# Patient Record
Sex: Male | Born: 1950 | Race: White | Hispanic: No | Marital: Married | Smoking: Never smoker
Health system: Southern US, Community
[De-identification: ages and names within clinical notes are randomized; demographics above are authoritative.]

## PROBLEM LIST (undated history)

## (undated) DIAGNOSIS — L570 Actinic keratosis: Secondary | ICD-10-CM

## (undated) HISTORY — DX: Actinic keratosis: L57.0

---

## 2005-01-17 ENCOUNTER — Ambulatory Visit: Payer: Self-pay | Admitting: General Practice

## 2010-04-25 ENCOUNTER — Ambulatory Visit: Payer: Self-pay | Admitting: Unknown Physician Specialty

## 2010-05-10 ENCOUNTER — Ambulatory Visit: Payer: Self-pay | Admitting: Unknown Physician Specialty

## 2010-12-11 ENCOUNTER — Ambulatory Visit: Payer: Self-pay

## 2012-05-06 ENCOUNTER — Ambulatory Visit: Payer: Self-pay

## 2012-05-06 LAB — CREATININE, SERUM: EGFR (African American): >60

## 2012-05-13 ENCOUNTER — Ambulatory Visit: Payer: Self-pay | Admitting: Internal Medicine

## 2012-06-01 ENCOUNTER — Ambulatory Visit: Payer: Self-pay | Admitting: Neurology

## 2012-10-26 ENCOUNTER — Ambulatory Visit: Payer: Self-pay | Admitting: Specialist

## 2013-12-14 ENCOUNTER — Ambulatory Visit: Payer: Self-pay | Admitting: Internal Medicine

## 2013-12-14 LAB — RAPID STREP-A WITH REFLX: Micro Text Report: NEGATIVE

## 2013-12-17 LAB — BETA STREP CULTURE(ARMC)

## 2014-02-23 DIAGNOSIS — K635 Polyp of colon: Secondary | ICD-10-CM | POA: Insufficient documentation

## 2014-02-23 DIAGNOSIS — K219 Gastro-esophageal reflux disease without esophagitis: Secondary | ICD-10-CM | POA: Insufficient documentation

## 2014-04-13 ENCOUNTER — Emergency Department: Payer: Self-pay | Admitting: Emergency Medicine

## 2014-04-13 DIAGNOSIS — F0781 Postconcussional syndrome: Secondary | ICD-10-CM | POA: Diagnosis not present

## 2014-04-13 DIAGNOSIS — G44309 Post-traumatic headache, unspecified, not intractable: Secondary | ICD-10-CM | POA: Diagnosis not present

## 2014-04-13 DIAGNOSIS — S0990XA Unspecified injury of head, initial encounter: Secondary | ICD-10-CM | POA: Diagnosis not present

## 2014-04-13 DIAGNOSIS — R51 Headache: Secondary | ICD-10-CM | POA: Diagnosis not present

## 2014-04-17 DIAGNOSIS — R519 Headache, unspecified: Secondary | ICD-10-CM | POA: Insufficient documentation

## 2014-04-17 DIAGNOSIS — M545 Low back pain, unspecified: Secondary | ICD-10-CM | POA: Insufficient documentation

## 2014-04-17 DIAGNOSIS — F0781 Postconcussional syndrome: Secondary | ICD-10-CM | POA: Insufficient documentation

## 2014-04-17 DIAGNOSIS — IMO0001 Reserved for inherently not codable concepts without codable children: Secondary | ICD-10-CM | POA: Insufficient documentation

## 2014-04-17 DIAGNOSIS — R6889 Other general symptoms and signs: Secondary | ICD-10-CM | POA: Diagnosis not present

## 2014-04-17 DIAGNOSIS — S060X0A Concussion without loss of consciousness, initial encounter: Secondary | ICD-10-CM | POA: Insufficient documentation

## 2014-04-17 DIAGNOSIS — R51 Headache: Secondary | ICD-10-CM | POA: Diagnosis not present

## 2014-05-10 DIAGNOSIS — Z23 Encounter for immunization: Secondary | ICD-10-CM | POA: Diagnosis not present

## 2014-05-10 DIAGNOSIS — L03113 Cellulitis of right upper limb: Secondary | ICD-10-CM | POA: Diagnosis not present

## 2014-05-10 DIAGNOSIS — L989 Disorder of the skin and subcutaneous tissue, unspecified: Secondary | ICD-10-CM | POA: Diagnosis not present

## 2014-05-22 DIAGNOSIS — L821 Other seborrheic keratosis: Secondary | ICD-10-CM | POA: Diagnosis not present

## 2014-05-22 DIAGNOSIS — L814 Other melanin hyperpigmentation: Secondary | ICD-10-CM | POA: Diagnosis not present

## 2014-05-22 DIAGNOSIS — L820 Inflamed seborrheic keratosis: Secondary | ICD-10-CM | POA: Diagnosis not present

## 2014-06-05 DIAGNOSIS — D485 Neoplasm of uncertain behavior of skin: Secondary | ICD-10-CM | POA: Diagnosis not present

## 2014-06-05 DIAGNOSIS — L080 Pyoderma: Secondary | ICD-10-CM | POA: Diagnosis not present

## 2014-06-05 DIAGNOSIS — T798XXA Other early complications of trauma, initial encounter: Secondary | ICD-10-CM | POA: Diagnosis not present

## 2014-06-14 DIAGNOSIS — E780 Pure hypercholesterolemia: Secondary | ICD-10-CM | POA: Diagnosis not present

## 2014-06-14 DIAGNOSIS — Z0000 Encounter for general adult medical examination without abnormal findings: Secondary | ICD-10-CM | POA: Diagnosis not present

## 2014-06-14 DIAGNOSIS — M79641 Pain in right hand: Secondary | ICD-10-CM | POA: Diagnosis not present

## 2014-06-14 DIAGNOSIS — Z79899 Other long term (current) drug therapy: Secondary | ICD-10-CM | POA: Diagnosis not present

## 2014-06-19 DIAGNOSIS — R51 Headache: Secondary | ICD-10-CM | POA: Diagnosis not present

## 2014-06-19 DIAGNOSIS — Z87820 Personal history of traumatic brain injury: Secondary | ICD-10-CM | POA: Insufficient documentation

## 2014-06-19 DIAGNOSIS — F0781 Postconcussional syndrome: Secondary | ICD-10-CM | POA: Diagnosis not present

## 2014-06-19 DIAGNOSIS — M545 Low back pain: Secondary | ICD-10-CM | POA: Diagnosis not present

## 2014-06-19 DIAGNOSIS — R6889 Other general symptoms and signs: Secondary | ICD-10-CM | POA: Diagnosis not present

## 2014-06-21 DIAGNOSIS — Z79899 Other long term (current) drug therapy: Secondary | ICD-10-CM | POA: Diagnosis not present

## 2014-06-21 DIAGNOSIS — D649 Anemia, unspecified: Secondary | ICD-10-CM | POA: Diagnosis not present

## 2014-06-21 DIAGNOSIS — E780 Pure hypercholesterolemia: Secondary | ICD-10-CM | POA: Diagnosis not present

## 2014-07-04 DIAGNOSIS — N400 Enlarged prostate without lower urinary tract symptoms: Secondary | ICD-10-CM | POA: Diagnosis not present

## 2014-07-04 DIAGNOSIS — N500 Atrophy of testis: Secondary | ICD-10-CM | POA: Diagnosis not present

## 2014-07-04 DIAGNOSIS — E291 Testicular hypofunction: Secondary | ICD-10-CM | POA: Diagnosis not present

## 2014-07-10 DIAGNOSIS — M67441 Ganglion, right hand: Secondary | ICD-10-CM | POA: Diagnosis not present

## 2014-07-10 DIAGNOSIS — M79641 Pain in right hand: Secondary | ICD-10-CM | POA: Diagnosis not present

## 2014-07-10 DIAGNOSIS — M720 Palmar fascial fibromatosis [Dupuytren]: Secondary | ICD-10-CM | POA: Diagnosis not present

## 2014-08-30 DIAGNOSIS — J069 Acute upper respiratory infection, unspecified: Secondary | ICD-10-CM | POA: Diagnosis not present

## 2015-01-01 DIAGNOSIS — N401 Enlarged prostate with lower urinary tract symptoms: Secondary | ICD-10-CM | POA: Diagnosis not present

## 2015-01-02 DIAGNOSIS — N401 Enlarged prostate with lower urinary tract symptoms: Secondary | ICD-10-CM | POA: Diagnosis not present

## 2015-01-02 DIAGNOSIS — R350 Frequency of micturition: Secondary | ICD-10-CM | POA: Diagnosis not present

## 2015-01-02 DIAGNOSIS — E291 Testicular hypofunction: Secondary | ICD-10-CM | POA: Diagnosis not present

## 2015-04-27 DIAGNOSIS — J069 Acute upper respiratory infection, unspecified: Secondary | ICD-10-CM | POA: Diagnosis not present

## 2015-07-17 DIAGNOSIS — E7800 Pure hypercholesterolemia, unspecified: Secondary | ICD-10-CM | POA: Diagnosis not present

## 2015-07-17 DIAGNOSIS — Z125 Encounter for screening for malignant neoplasm of prostate: Secondary | ICD-10-CM | POA: Diagnosis not present

## 2015-07-17 DIAGNOSIS — Z79899 Other long term (current) drug therapy: Secondary | ICD-10-CM | POA: Diagnosis not present

## 2015-07-24 DIAGNOSIS — R7309 Other abnormal glucose: Secondary | ICD-10-CM | POA: Diagnosis not present

## 2015-07-24 DIAGNOSIS — Z125 Encounter for screening for malignant neoplasm of prostate: Secondary | ICD-10-CM | POA: Diagnosis not present

## 2015-07-24 DIAGNOSIS — E7800 Pure hypercholesterolemia, unspecified: Secondary | ICD-10-CM | POA: Diagnosis not present

## 2015-07-24 DIAGNOSIS — Z79899 Other long term (current) drug therapy: Secondary | ICD-10-CM | POA: Diagnosis not present

## 2015-07-24 DIAGNOSIS — Z0000 Encounter for general adult medical examination without abnormal findings: Secondary | ICD-10-CM | POA: Diagnosis not present

## 2015-07-24 DIAGNOSIS — F0781 Postconcussional syndrome: Secondary | ICD-10-CM | POA: Diagnosis not present

## 2015-10-18 ENCOUNTER — Emergency Department
Admission: EM | Admit: 2015-10-18 | Discharge: 2015-10-18 | Disposition: A | Discharge status: Home / Self Care | Payer: Commercial Managed Care - HMO | Attending: Emergency Medicine | Admitting: Emergency Medicine

## 2015-10-18 DIAGNOSIS — L500 Allergic urticaria: Secondary | ICD-10-CM | POA: Insufficient documentation

## 2015-10-18 DIAGNOSIS — L509 Urticaria, unspecified: Secondary | ICD-10-CM | POA: Diagnosis not present

## 2015-10-18 DIAGNOSIS — T7840XA Allergy, unspecified, initial encounter: Secondary | ICD-10-CM

## 2015-10-18 MED ORDER — FAMOTIDINE 20 MG PO TABS: 20.0000 mg | ORAL_TABLET | Freq: Two times a day (BID) | ORAL | 0 refills | Status: DC

## 2015-10-18 MED ORDER — SODIUM CHLORIDE 0.9 % IV SOLN: INTRAVENOUS | Status: DC

## 2015-10-18 MED ORDER — SODIUM CHLORIDE 0.9 % IV BOLUS (SEPSIS)
1000.0000 mL | Freq: Once | INTRAVENOUS | Status: AC
  Administered 2015-10-18: 1000 mL via INTRAVENOUS

## 2015-10-18 MED ORDER — FAMOTIDINE IN NACL 20-0.9 MG/50ML-% IV SOLN
INTRAVENOUS | Status: AC
  Filled 2015-10-18: qty 50

## 2015-10-18 MED ORDER — FAMOTIDINE IN NACL 20-0.9 MG/50ML-% IV SOLN
20.0000 mg | Freq: Once | INTRAVENOUS | Status: AC
  Administered 2015-10-18: 20 mg via INTRAVENOUS

## 2015-10-18 MED ORDER — EPINEPHRINE 0.3 MG/0.3ML IJ SOAJ: 0.3000 mg | Freq: Once | INTRAMUSCULAR | 1 refills | Status: AC

## 2015-10-18 MED ORDER — PREDNISONE 10 MG (21) PO TBPK: ORAL_TABLET | ORAL | 0 refills | Status: DC

## 2015-10-18 MED ORDER — DIPHENHYDRAMINE HCL 50 MG/ML IJ SOLN
12.5000 mg | Freq: Once | INTRAMUSCULAR | Status: AC
  Administered 2015-10-18: 12.5 mg via INTRAVENOUS
  Filled 2015-10-18: qty 1

## 2015-10-18 MED ORDER — METHYLPREDNISOLONE SODIUM SUCC 125 MG IJ SOLR
125.0000 mg | Freq: Once | INTRAMUSCULAR | Status: AC
  Administered 2015-10-18: 125 mg via INTRAVENOUS
  Filled 2015-10-18: qty 2

## 2015-10-18 NOTE — ED Notes (Signed)
Discharge instructions reviewed with patient. Patient verbalized understanding. Patient ambulated to lobby without difficulty.   

## 2015-10-18 NOTE — ED Provider Notes (Signed)
[facility] Emergency Department Provider Note  ____________________________________________  Time seen: Approximately 7:51 PM  I have reviewed the triage vital signs and the nursing notes.   HISTORY  Chief Complaint Allergic Reaction   HPI Franklin Eaton is a 65 y.o. male who presents to the emergency department for evaluation of allergic reaction. Takes that after working in the yard [employer], he came in and took a shower and then noticed that he was itching and noted hives on his buttocks, pelvic region, lower extremities, and hands. He states that he took 50 mg Benadryl because he felt that his throat was swelling and feeling like his tongue was swelling. He states that the Benadryl that that feeling away, but continues to have intense itching in his hands and lower trunk/pelvic region. He has not had this reaction in the past. He is otherwise healthy and doesn't take any medications and has no chronic illness.  History reviewed. No pertinent past medical history.  There are no active problems to display for this patient.   History reviewed. No pertinent surgical history.  Prior to Admission medications   Medication Sig Start Date End Date Taking? Authorizing Provider  EPINEPHrine 0.3 mg/0.3 mL IJ SOAJ injection Inject 0.3 mLs (0.3 mg total) into the muscle once. 10/18/15 10/18/15  Victorino Dike, FNP  famotidine (PEPCID) 20 MG tablet Take 1 tablet (20 mg total) by mouth 2 (two) times daily. 10/18/15 10/25/15  Victorino Dike, FNP  predniSONE (STERAPRED UNI-PAK 21 TAB) 10 MG (21) TBPK tablet Take 6 tablets on day 1 Take 5 tablets on day 2 Take 4 tablets on day 3 Take 3 tablets on day 4 Take 2 tablets on day 5 Take 1 tablet on day 6 10/18/15   Victorino Dike, FNP    Allergies Review of patient's allergies indicates no known allergies.  Family History  Problem Relation Age of Onset  . Hyperlipidemia Brother     Social History Social History   Substance Use Topics  . Smoking status: Never Smoker  . Smokeless tobacco: Never Used  . Alcohol use No    Review of Systems  Constitutional: Negative for fever/chills Respiratory: Negative for shortness of breath. Musculoskeletal: Negative for pain. Skin: Positive for hives and itching Neurological: Negative for headaches, focal weakness or numbness. ____________________________________________   PHYSICAL EXAM:  VITAL SIGNS: ED Triage Vitals [10/18/15 1943]  Enc Vitals Group     BP 119/80     Pulse Rate 61     Resp 18     Temp 97.9 F (36.6 C)     Temp Source Oral     SpO2 97 %     Weight 130 lb (59 kg)     Height 5\' 7"  (1.702 m)     Head Circumference      Peak Flow      Pain Score      Pain Loc      Pain Edu?      Excl. in [place]?      Constitutional: Alert and oriented. Well appearing and in no acute distress. Eyes: Conjunctivae are normal. EOMI. Nose: No congestion/rhinnorhea. Mouth/Throat: Mucous membranes are moist. Airway is patent without edema. Neck: No stridor. Cardiovascular: Good peripheral circulation. Respiratory: Normal respiratory effort.  No retractions. Lungs to auscultation throughout. Musculoskeletal: FROM throughout. Neurologic:  Normal speech and language. No gross focal neurologic deficits are appreciated. Skin:  Hives present on his lower back, buttocks, pelvis, bilateral lower extremities, and erythema  to bilateral hands.  ____________________________________________   LABS (all labs ordered are listed, but only abnormal results are displayed)  Labs Reviewed - No data to display ____________________________________________  EKG   ____________________________________________  RADIOLOGY  Not indicated ____________________________________________   PROCEDURES  Procedure(s) performed: None ____________________________________________   INITIAL IMPRESSION / ASSESSMENT AND PLAN / ED COURSE  Pertinent labs & imaging results  that were available during my care of the patient were reviewed by me and considered in my medical decision making (see chart for details).  IV Solu-Medrol, fluids, and Pepcid ordered. Patient has already had 50 mg Benadryl prior to arrival so we will not give him any additional this time.  See notes from the ED Course  He will be advised to take the medications as prescribed including benadryl every 6 hours. He was given a prescription for an Epi-Pen and advised on indications for use.  He was advised to follow up with Dr. Doy Hutching as needed.  He was also given strict return precautions and verbalized understanding. ____________________________________________   FINAL CLINICAL IMPRESSION(S) / ED DIAGNOSES  Final diagnoses:  Hives  Allergic reaction, initial encounter    New Prescriptions   EPINEPHRINE 0.3 MG/0.3 ML IJ SOAJ INJECTION    Inject 0.3 mLs (0.3 mg total) into the muscle once.   FAMOTIDINE (PEPCID) 20 MG TABLET    Take 1 tablet (20 mg total) by mouth 2 (two) times daily.   PREDNISONE (STERAPRED UNI-PAK 21 TAB) 10 MG (21) TBPK TABLET    Take 6 tablets on day 1 Take 5 tablets on day 2 Take 4 tablets on day 3 Take 3 tablets on day 4 Take 2 tablets on day 5 Take 1 tablet on day 6    Note:  This document was prepared using Dragon voice recognition software and may include unintentional dictation errors.    Victorino Dike, FNP 10/18/15 [id number]    Hinda Kehr, MD 10/18/15 [id number]

## 2015-10-18 NOTE — ED Notes (Signed)
PA at bedside.

## 2015-10-18 NOTE — Discharge Instructions (Signed)
Continue taking the Benadryl in addition to the prescribed medications.  Return to the ER immediately for return of tongue swelling, throat swelling, or other symptoms of concern.

## 2015-10-18 NOTE — ED Triage Notes (Signed)
Pt reports he was working in yard today. Pt showered and then started to notice his hands itching. Itching progressed to feet.   Pt c/o hand/feet itching, rash on pelvic area, buttocks, and back. Pt reports he felt like his tongue was swelling/problems swallowing.   Pt took 2 benadryl 30 minutes ago, and has reports he is able to swallow, however still feels like his tongue is swollen

## 2015-12-31 DIAGNOSIS — R351 Nocturia: Secondary | ICD-10-CM | POA: Diagnosis not present

## 2015-12-31 DIAGNOSIS — R3914 Feeling of incomplete bladder emptying: Secondary | ICD-10-CM | POA: Diagnosis not present

## 2015-12-31 DIAGNOSIS — N401 Enlarged prostate with lower urinary tract symptoms: Secondary | ICD-10-CM | POA: Diagnosis not present

## 2015-12-31 DIAGNOSIS — R350 Frequency of micturition: Secondary | ICD-10-CM | POA: Diagnosis not present

## 2016-02-05 DIAGNOSIS — D1800 Hemangioma unspecified site: Secondary | ICD-10-CM | POA: Diagnosis not present

## 2016-02-05 DIAGNOSIS — D485 Neoplasm of uncertain behavior of skin: Secondary | ICD-10-CM | POA: Diagnosis not present

## 2016-02-05 DIAGNOSIS — L821 Other seborrheic keratosis: Secondary | ICD-10-CM | POA: Diagnosis not present

## 2016-02-05 DIAGNOSIS — C44629 Squamous cell carcinoma of skin of left upper limb, including shoulder: Secondary | ICD-10-CM | POA: Diagnosis not present

## 2016-02-05 DIAGNOSIS — L814 Other melanin hyperpigmentation: Secondary | ICD-10-CM | POA: Diagnosis not present

## 2016-03-03 DIAGNOSIS — C44629 Squamous cell carcinoma of skin of left upper limb, including shoulder: Secondary | ICD-10-CM | POA: Diagnosis not present

## 2016-03-03 DIAGNOSIS — C4492 Squamous cell carcinoma of skin, unspecified: Secondary | ICD-10-CM

## 2016-03-03 HISTORY — DX: Squamous cell carcinoma of skin, unspecified: C44.92

## 2016-04-08 DIAGNOSIS — J069 Acute upper respiratory infection, unspecified: Secondary | ICD-10-CM | POA: Diagnosis not present

## 2016-07-17 DIAGNOSIS — E7800 Pure hypercholesterolemia, unspecified: Secondary | ICD-10-CM | POA: Diagnosis not present

## 2016-07-17 DIAGNOSIS — Z79899 Other long term (current) drug therapy: Secondary | ICD-10-CM | POA: Diagnosis not present

## 2016-07-17 DIAGNOSIS — Z125 Encounter for screening for malignant neoplasm of prostate: Secondary | ICD-10-CM | POA: Diagnosis not present

## 2016-07-17 DIAGNOSIS — R7309 Other abnormal glucose: Secondary | ICD-10-CM | POA: Diagnosis not present

## 2016-07-24 DIAGNOSIS — R7309 Other abnormal glucose: Secondary | ICD-10-CM | POA: Diagnosis not present

## 2016-07-24 DIAGNOSIS — Z79899 Other long term (current) drug therapy: Secondary | ICD-10-CM | POA: Diagnosis not present

## 2016-07-24 DIAGNOSIS — Z125 Encounter for screening for malignant neoplasm of prostate: Secondary | ICD-10-CM | POA: Diagnosis not present

## 2016-07-24 DIAGNOSIS — E7800 Pure hypercholesterolemia, unspecified: Secondary | ICD-10-CM | POA: Diagnosis not present

## 2016-07-24 DIAGNOSIS — Z0000 Encounter for general adult medical examination without abnormal findings: Secondary | ICD-10-CM | POA: Diagnosis not present

## 2016-09-08 DIAGNOSIS — D229 Melanocytic nevi, unspecified: Secondary | ICD-10-CM | POA: Diagnosis not present

## 2016-09-08 DIAGNOSIS — Z85828 Personal history of other malignant neoplasm of skin: Secondary | ICD-10-CM | POA: Diagnosis not present

## 2016-09-08 DIAGNOSIS — L821 Other seborrheic keratosis: Secondary | ICD-10-CM | POA: Diagnosis not present

## 2016-09-08 DIAGNOSIS — L814 Other melanin hyperpigmentation: Secondary | ICD-10-CM | POA: Diagnosis not present

## 2016-09-08 DIAGNOSIS — D1800 Hemangioma unspecified site: Secondary | ICD-10-CM | POA: Diagnosis not present

## 2016-09-08 DIAGNOSIS — L820 Inflamed seborrheic keratosis: Secondary | ICD-10-CM | POA: Diagnosis not present

## 2016-09-08 DIAGNOSIS — L859 Epidermal thickening, unspecified: Secondary | ICD-10-CM | POA: Diagnosis not present

## 2016-11-06 DIAGNOSIS — M79641 Pain in right hand: Secondary | ICD-10-CM | POA: Diagnosis not present

## 2017-02-05 DIAGNOSIS — H2513 Age-related nuclear cataract, bilateral: Secondary | ICD-10-CM | POA: Diagnosis not present

## 2017-04-21 DIAGNOSIS — L820 Inflamed seborrheic keratosis: Secondary | ICD-10-CM | POA: Diagnosis not present

## 2017-04-21 DIAGNOSIS — L821 Other seborrheic keratosis: Secondary | ICD-10-CM | POA: Diagnosis not present

## 2017-04-21 DIAGNOSIS — R21 Rash and other nonspecific skin eruption: Secondary | ICD-10-CM | POA: Diagnosis not present

## 2017-04-21 DIAGNOSIS — Z85828 Personal history of other malignant neoplasm of skin: Secondary | ICD-10-CM | POA: Diagnosis not present

## 2017-04-21 DIAGNOSIS — Z1283 Encounter for screening for malignant neoplasm of skin: Secondary | ICD-10-CM | POA: Diagnosis not present

## 2017-04-21 DIAGNOSIS — L309 Dermatitis, unspecified: Secondary | ICD-10-CM | POA: Diagnosis not present

## 2017-07-20 DIAGNOSIS — Z125 Encounter for screening for malignant neoplasm of prostate: Secondary | ICD-10-CM | POA: Diagnosis not present

## 2017-07-20 DIAGNOSIS — E7800 Pure hypercholesterolemia, unspecified: Secondary | ICD-10-CM | POA: Diagnosis not present

## 2017-07-20 DIAGNOSIS — R7309 Other abnormal glucose: Secondary | ICD-10-CM | POA: Diagnosis not present

## 2017-07-20 DIAGNOSIS — Z79899 Other long term (current) drug therapy: Secondary | ICD-10-CM | POA: Diagnosis not present

## 2017-07-27 DIAGNOSIS — Z0000 Encounter for general adult medical examination without abnormal findings: Secondary | ICD-10-CM | POA: Diagnosis not present

## 2017-07-27 DIAGNOSIS — D649 Anemia, unspecified: Secondary | ICD-10-CM | POA: Diagnosis not present

## 2017-07-27 DIAGNOSIS — E7800 Pure hypercholesterolemia, unspecified: Secondary | ICD-10-CM | POA: Diagnosis not present

## 2017-07-27 DIAGNOSIS — R7309 Other abnormal glucose: Secondary | ICD-10-CM | POA: Diagnosis not present

## 2017-07-27 DIAGNOSIS — Z79899 Other long term (current) drug therapy: Secondary | ICD-10-CM | POA: Diagnosis not present

## 2017-07-27 DIAGNOSIS — M545 Low back pain: Secondary | ICD-10-CM | POA: Diagnosis not present

## 2017-07-27 DIAGNOSIS — G8929 Other chronic pain: Secondary | ICD-10-CM | POA: Diagnosis not present

## 2017-08-13 DIAGNOSIS — J40 Bronchitis, not specified as acute or chronic: Secondary | ICD-10-CM | POA: Diagnosis not present

## 2018-02-09 DIAGNOSIS — Z9889 Other specified postprocedural states: Secondary | ICD-10-CM | POA: Diagnosis not present

## 2018-02-09 DIAGNOSIS — M7551 Bursitis of right shoulder: Secondary | ICD-10-CM | POA: Diagnosis not present

## 2018-02-09 DIAGNOSIS — M19111 Post-traumatic osteoarthritis, right shoulder: Secondary | ICD-10-CM | POA: Diagnosis not present

## 2018-02-09 DIAGNOSIS — G8929 Other chronic pain: Secondary | ICD-10-CM | POA: Diagnosis not present

## 2018-02-09 DIAGNOSIS — M25511 Pain in right shoulder: Secondary | ICD-10-CM | POA: Diagnosis not present

## 2018-02-09 DIAGNOSIS — M7581 Other shoulder lesions, right shoulder: Secondary | ICD-10-CM | POA: Diagnosis not present

## 2018-02-09 DIAGNOSIS — M7541 Impingement syndrome of right shoulder: Secondary | ICD-10-CM | POA: Diagnosis not present

## 2018-12-06 ENCOUNTER — Other Ambulatory Visit (HOSPITAL_COMMUNITY): Payer: Self-pay | Admitting: Sports Medicine

## 2018-12-06 ENCOUNTER — Other Ambulatory Visit: Payer: Self-pay | Admitting: Sports Medicine

## 2018-12-06 DIAGNOSIS — M7541 Impingement syndrome of right shoulder: Secondary | ICD-10-CM | POA: Diagnosis not present

## 2018-12-06 DIAGNOSIS — M25511 Pain in right shoulder: Secondary | ICD-10-CM | POA: Diagnosis not present

## 2018-12-06 DIAGNOSIS — M25512 Pain in left shoulder: Secondary | ICD-10-CM | POA: Diagnosis not present

## 2018-12-06 DIAGNOSIS — Z9889 Other specified postprocedural states: Secondary | ICD-10-CM | POA: Diagnosis not present

## 2018-12-06 DIAGNOSIS — M19111 Post-traumatic osteoarthritis, right shoulder: Secondary | ICD-10-CM

## 2018-12-06 DIAGNOSIS — G8929 Other chronic pain: Secondary | ICD-10-CM | POA: Diagnosis not present

## 2018-12-06 DIAGNOSIS — M778 Other enthesopathies, not elsewhere classified: Secondary | ICD-10-CM

## 2018-12-06 DIAGNOSIS — M7581 Other shoulder lesions, right shoulder: Secondary | ICD-10-CM | POA: Diagnosis not present

## 2018-12-13 ENCOUNTER — Other Ambulatory Visit: Payer: Self-pay

## 2018-12-13 ENCOUNTER — Ambulatory Visit
Admission: RE | Admit: 2018-12-13 | Discharge: 2018-12-13 | Disposition: A | Discharge status: Home / Self Care | Payer: Medicare HMO | Source: Ambulatory Visit | Attending: Sports Medicine | Admitting: Sports Medicine

## 2018-12-13 DIAGNOSIS — Z9889 Other specified postprocedural states: Secondary | ICD-10-CM | POA: Diagnosis not present

## 2018-12-13 DIAGNOSIS — G8929 Other chronic pain: Secondary | ICD-10-CM | POA: Insufficient documentation

## 2018-12-13 DIAGNOSIS — M25511 Pain in right shoulder: Secondary | ICD-10-CM | POA: Diagnosis not present

## 2018-12-13 DIAGNOSIS — M7541 Impingement syndrome of right shoulder: Secondary | ICD-10-CM | POA: Diagnosis not present

## 2018-12-13 DIAGNOSIS — M7581 Other shoulder lesions, right shoulder: Secondary | ICD-10-CM | POA: Diagnosis not present

## 2018-12-13 DIAGNOSIS — M778 Other enthesopathies, not elsewhere classified: Secondary | ICD-10-CM

## 2018-12-13 DIAGNOSIS — M19111 Post-traumatic osteoarthritis, right shoulder: Secondary | ICD-10-CM | POA: Insufficient documentation

## 2018-12-15 DIAGNOSIS — M75121 Complete rotator cuff tear or rupture of right shoulder, not specified as traumatic: Secondary | ICD-10-CM | POA: Diagnosis not present

## 2018-12-15 DIAGNOSIS — M67921 Unspecified disorder of synovium and tendon, right upper arm: Secondary | ICD-10-CM | POA: Diagnosis not present

## 2018-12-15 DIAGNOSIS — M7542 Impingement syndrome of left shoulder: Secondary | ICD-10-CM | POA: Diagnosis not present

## 2018-12-15 DIAGNOSIS — M25511 Pain in right shoulder: Secondary | ICD-10-CM | POA: Diagnosis not present

## 2018-12-15 DIAGNOSIS — M19111 Post-traumatic osteoarthritis, right shoulder: Secondary | ICD-10-CM | POA: Diagnosis not present

## 2018-12-15 DIAGNOSIS — M65811 Other synovitis and tenosynovitis, right shoulder: Secondary | ICD-10-CM | POA: Diagnosis not present

## 2018-12-15 DIAGNOSIS — G8929 Other chronic pain: Secondary | ICD-10-CM | POA: Diagnosis not present

## 2018-12-15 DIAGNOSIS — M25512 Pain in left shoulder: Secondary | ICD-10-CM | POA: Diagnosis not present

## 2018-12-15 DIAGNOSIS — M7522 Bicipital tendinitis, left shoulder: Secondary | ICD-10-CM | POA: Diagnosis not present

## 2018-12-20 DIAGNOSIS — M75121 Complete rotator cuff tear or rupture of right shoulder, not specified as traumatic: Secondary | ICD-10-CM | POA: Diagnosis not present

## 2018-12-20 DIAGNOSIS — M19011 Primary osteoarthritis, right shoulder: Secondary | ICD-10-CM | POA: Diagnosis not present

## 2018-12-22 DIAGNOSIS — M75121 Complete rotator cuff tear or rupture of right shoulder, not specified as traumatic: Secondary | ICD-10-CM | POA: Insufficient documentation

## 2019-01-31 DIAGNOSIS — H0013 Chalazion right eye, unspecified eyelid: Secondary | ICD-10-CM | POA: Diagnosis not present

## 2019-03-24 DIAGNOSIS — M659 Synovitis and tenosynovitis, unspecified: Secondary | ICD-10-CM | POA: Diagnosis not present

## 2019-03-24 DIAGNOSIS — M2011 Hallux valgus (acquired), right foot: Secondary | ICD-10-CM | POA: Diagnosis not present

## 2019-03-24 DIAGNOSIS — M79671 Pain in right foot: Secondary | ICD-10-CM | POA: Diagnosis not present

## 2019-04-14 DIAGNOSIS — M2011 Hallux valgus (acquired), right foot: Secondary | ICD-10-CM | POA: Diagnosis not present

## 2019-04-14 DIAGNOSIS — M659 Synovitis and tenosynovitis, unspecified: Secondary | ICD-10-CM | POA: Diagnosis not present

## 2019-04-14 DIAGNOSIS — M2012 Hallux valgus (acquired), left foot: Secondary | ICD-10-CM | POA: Diagnosis not present

## 2019-04-28 DIAGNOSIS — R310 Gross hematuria: Secondary | ICD-10-CM | POA: Diagnosis not present

## 2019-04-28 DIAGNOSIS — R300 Dysuria: Secondary | ICD-10-CM | POA: Diagnosis not present

## 2019-05-02 ENCOUNTER — Ambulatory Visit: Payer: Medicare HMO

## 2019-05-23 ENCOUNTER — Other Ambulatory Visit: Payer: Self-pay | Admitting: Podiatry

## 2019-05-25 ENCOUNTER — Other Ambulatory Visit: Payer: Self-pay | Admitting: Podiatry

## 2019-05-25 DIAGNOSIS — G5761 Lesion of plantar nerve, right lower limb: Secondary | ICD-10-CM

## 2019-06-06 ENCOUNTER — Other Ambulatory Visit: Payer: Self-pay

## 2019-06-06 ENCOUNTER — Ambulatory Visit
Admission: RE | Admit: 2019-06-06 | Discharge: 2019-06-06 | Disposition: A | Discharge status: Home / Self Care | Payer: Medicare HMO | Source: Ambulatory Visit | Attending: Podiatry | Admitting: Podiatry

## 2019-06-06 DIAGNOSIS — M19071 Primary osteoarthritis, right ankle and foot: Secondary | ICD-10-CM | POA: Diagnosis not present

## 2019-06-06 DIAGNOSIS — M25474 Effusion, right foot: Secondary | ICD-10-CM | POA: Diagnosis not present

## 2019-06-06 DIAGNOSIS — G5761 Lesion of plantar nerve, right lower limb: Secondary | ICD-10-CM | POA: Diagnosis not present

## 2019-06-06 MED ORDER — GADOBUTROL 1 MMOL/ML IV SOLN
5.0000 mL | Freq: Once | INTRAVENOUS | Status: AC | PRN
Start: 2019-06-06 — End: 2019-06-06
  Administered 2019-06-06: 10:00:00 5 mL via INTRAVENOUS

## 2019-06-16 DIAGNOSIS — G5761 Lesion of plantar nerve, right lower limb: Secondary | ICD-10-CM | POA: Diagnosis not present

## 2019-06-16 DIAGNOSIS — M19071 Primary osteoarthritis, right ankle and foot: Secondary | ICD-10-CM | POA: Diagnosis not present

## 2019-06-21 ENCOUNTER — Other Ambulatory Visit: Payer: Self-pay | Admitting: Internal Medicine

## 2019-06-21 DIAGNOSIS — R140 Abdominal distension (gaseous): Secondary | ICD-10-CM

## 2019-06-27 ENCOUNTER — Other Ambulatory Visit: Payer: Self-pay

## 2019-06-27 ENCOUNTER — Ambulatory Visit
Admission: RE | Admit: 2019-06-27 | Discharge: 2019-06-27 | Disposition: A | Discharge status: Home / Self Care | Payer: Medicare HMO | Source: Ambulatory Visit | Attending: Internal Medicine | Admitting: Internal Medicine

## 2019-06-27 DIAGNOSIS — R140 Abdominal distension (gaseous): Secondary | ICD-10-CM | POA: Diagnosis not present

## 2019-11-11 DIAGNOSIS — R05 Cough: Secondary | ICD-10-CM | POA: Diagnosis not present

## 2019-11-11 DIAGNOSIS — G8929 Other chronic pain: Secondary | ICD-10-CM | POA: Diagnosis not present

## 2019-11-11 DIAGNOSIS — R918 Other nonspecific abnormal finding of lung field: Secondary | ICD-10-CM | POA: Diagnosis not present

## 2019-11-11 DIAGNOSIS — Z125 Encounter for screening for malignant neoplasm of prostate: Secondary | ICD-10-CM | POA: Diagnosis not present

## 2019-11-11 DIAGNOSIS — Z79899 Other long term (current) drug therapy: Secondary | ICD-10-CM | POA: Diagnosis not present

## 2019-11-11 DIAGNOSIS — E7800 Pure hypercholesterolemia, unspecified: Secondary | ICD-10-CM | POA: Diagnosis not present

## 2019-11-11 DIAGNOSIS — M79671 Pain in right foot: Secondary | ICD-10-CM | POA: Diagnosis not present

## 2019-11-11 DIAGNOSIS — M47814 Spondylosis without myelopathy or radiculopathy, thoracic region: Secondary | ICD-10-CM | POA: Diagnosis not present

## 2019-11-11 DIAGNOSIS — Z0000 Encounter for general adult medical examination without abnormal findings: Secondary | ICD-10-CM | POA: Diagnosis not present

## 2019-11-11 DIAGNOSIS — J9811 Atelectasis: Secondary | ICD-10-CM | POA: Diagnosis not present

## 2019-11-11 DIAGNOSIS — R7309 Other abnormal glucose: Secondary | ICD-10-CM | POA: Diagnosis not present

## 2019-11-14 ENCOUNTER — Ambulatory Visit: Payer: Medicare HMO | Admitting: Dermatology

## 2019-11-18 DIAGNOSIS — Z79899 Other long term (current) drug therapy: Secondary | ICD-10-CM | POA: Diagnosis not present

## 2019-11-18 DIAGNOSIS — Z125 Encounter for screening for malignant neoplasm of prostate: Secondary | ICD-10-CM | POA: Diagnosis not present

## 2019-11-18 DIAGNOSIS — M79671 Pain in right foot: Secondary | ICD-10-CM | POA: Diagnosis not present

## 2019-11-18 DIAGNOSIS — R7309 Other abnormal glucose: Secondary | ICD-10-CM | POA: Diagnosis not present

## 2019-11-18 DIAGNOSIS — Z0000 Encounter for general adult medical examination without abnormal findings: Secondary | ICD-10-CM | POA: Diagnosis not present

## 2019-11-18 DIAGNOSIS — E7800 Pure hypercholesterolemia, unspecified: Secondary | ICD-10-CM | POA: Diagnosis not present

## 2019-11-18 DIAGNOSIS — R05 Cough: Secondary | ICD-10-CM | POA: Diagnosis not present

## 2019-11-23 ENCOUNTER — Ambulatory Visit: Payer: Medicare HMO | Admitting: Dermatology

## 2019-11-23 ENCOUNTER — Other Ambulatory Visit: Payer: Self-pay

## 2019-11-23 DIAGNOSIS — D1800 Hemangioma unspecified site: Secondary | ICD-10-CM | POA: Diagnosis not present

## 2019-11-23 DIAGNOSIS — W57XXXA Bitten or stung by nonvenomous insect and other nonvenomous arthropods, initial encounter: Secondary | ICD-10-CM

## 2019-11-23 DIAGNOSIS — L814 Other melanin hyperpigmentation: Secondary | ICD-10-CM | POA: Diagnosis not present

## 2019-11-23 DIAGNOSIS — D229 Melanocytic nevi, unspecified: Secondary | ICD-10-CM

## 2019-11-23 DIAGNOSIS — L570 Actinic keratosis: Secondary | ICD-10-CM

## 2019-11-23 DIAGNOSIS — L821 Other seborrheic keratosis: Secondary | ICD-10-CM

## 2019-11-23 DIAGNOSIS — Z85828 Personal history of other malignant neoplasm of skin: Secondary | ICD-10-CM

## 2019-11-23 DIAGNOSIS — S50861A Insect bite (nonvenomous) of right forearm, initial encounter: Secondary | ICD-10-CM

## 2019-11-23 DIAGNOSIS — L820 Inflamed seborrheic keratosis: Secondary | ICD-10-CM

## 2019-11-23 DIAGNOSIS — Z1283 Encounter for screening for malignant neoplasm of skin: Secondary | ICD-10-CM

## 2019-11-23 DIAGNOSIS — L578 Other skin changes due to chronic exposure to nonionizing radiation: Secondary | ICD-10-CM

## 2019-11-23 MED ORDER — TRIAMCINOLONE ACETONIDE 0.1 % EX OINT: 1.0000 "application " | TOPICAL_OINTMENT | Freq: Two times a day (BID) | CUTANEOUS | 1 refills | Status: DC | PRN

## 2019-11-23 NOTE — Patient Instructions (Signed)
Cryotherapy Aftercare  . Wash gently with soap and water everyday.   . Apply Vaseline and Band-Aid daily until healed.  Prior to procedure, discussed risks of blister formation, small wound, skin dyspigmentation, or rare scar following cryotherapy.    Topical steroids (such as triamcinolone, fluocinolone, fluocinonide, mometasone, clobetasol, halobetasol, betamethasone, hydrocortisone) can cause thinning and lightening of the skin if they are used for too long in the same area. Your physician has selected the right strength medicine for your problem and area affected on the body. Please use your medication only as directed by your physician to prevent side effects.   

## 2019-11-23 NOTE — Progress Notes (Addendum)
Follow-Up Visit   Subjective  Franklin Eaton is a 69 y.o. male who presents for the following: No chief complaint on file.Marland Kitchen  He has some itchy spots on his back and face and scalp.  Red bumps on arms and legs, he thinks it may be poison ivy.  Has been treated with steroid for that in past.  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    The following portions of the chart were reviewed this encounter and updated as appropriate:     Review of Systems: No other skin or systemic complaints except as noted in HPI or Assessment and Plan.  Objective  Well appearing patient in no apparent distress; mood and affect are within normal limits.  A focused examination was performed including face, scalp, torso, arms, hands, left foot. Relevant physical exam findings are noted in the Assessment and Plan.  Objective  Right Forearm - Posterior, left foot, left knee: Pink papules, some with vesiculation  Objective  Left Forearm - Posterior: Well healed scar with no evidence of recurrence  Objective  Left Upper Back spinal x2, left back x1, right posterior shoulder x1, left mandible x1, left temporal scalp x1 (6): Erythematous keratotic or waxy stuck-on papule or plaque.   Objective  Dorsum of Nose x1: Erythematous thin papules/macules with gritty scale.   Assessment & Plan    Lentigines - Scattered tan macules - Discussed due to sun exposure - Benign, observe - Call for any changes  Seborrheic Keratoses - Stuck-on, waxy, tan-brown papules and plaques  - Discussed benign etiology and prognosis. - Observe - Call for any changes  Melanocytic Nevi - Tan-brown and/or pink-flesh-colored symmetric macules and papules - Benign appearing on exam today - Observation - Call clinic for new or changing moles - Recommend daily use of broad spectrum spf 30+ sunscreen to sun-exposed areas.   Hemangiomas - Red papules - Discussed benign nature - Observe - Call for any changes  Actinic Damage -  diffuse scaly erythematous macules with underlying dyspigmentation - Recommend daily broad spectrum sunscreen SPF 30+ to sun-exposed areas, reapply every 2 hours as needed.  - Call for new or changing lesions.  Skin cancer screening performed today.  Bug bite without infection, initial encounter Right Forearm - Posterior, left foot, left knee  Discussed using Deet containing insect repellant when outdoors Also long sleeves, pants, socks when outdoors Start Triamcinolone ointment bid to aas prn itch  triamcinolone ointment (KENALOG) 0.1 % - Right Forearm - Posterior, left foot, left knee  History of SCC (squamous cell carcinoma) of skin Left Forearm - Posterior  Clear. Observe for recurrence. Call clinic for new or changing lesions.  Recommend regular skin exams, daily broad-spectrum spf 30+ sunscreen use, and photoprotection.     Inflamed seborrheic keratosis (6) Left Upper Back spinal x2, left back x1, right posterior shoulder x1, left mandible x1, left temporal scalp x1  Destruction of lesion - Left Upper Back spinal x2, left back x1, right posterior shoulder x1, left mandible x1, left temporal scalp x1  Destruction method: cryotherapy   Informed consent: discussed and consent obtained   Lesion destroyed using liquid nitrogen: Yes   Region frozen until ice ball extended beyond lesion: Yes   Outcome: patient tolerated procedure well with no complications   Post-procedure details: wound care instructions given    AK (actinic keratosis) Dorsum of Nose x1  RTC 4-6 weeks if not resolved.   Destruction of lesion - Dorsum of Nose x1  Destruction method: cryotherapy   Informed consent: discussed and consent obtained   Lesion destroyed using liquid nitrogen: Yes   Region frozen until ice ball extended beyond lesion: Yes   Outcome: patient tolerated procedure well with no complications   Post-procedure details: wound care instructions given    Return in about 1 year (around  11/22/2020) for annual skin exam.   I, Emelia Salisbury, CMA, am acting as scribe for Brendolyn Patty, MD.  Documentation: I have reviewed the above documentation for accuracy and completeness, and I agree with the above.  Brendolyn Patty MD

## 2019-11-29 DIAGNOSIS — R05 Cough: Secondary | ICD-10-CM | POA: Diagnosis not present

## 2019-11-29 DIAGNOSIS — R9389 Abnormal findings on diagnostic imaging of other specified body structures: Secondary | ICD-10-CM | POA: Diagnosis not present

## 2019-11-30 ENCOUNTER — Encounter: Payer: Self-pay | Admitting: Podiatry

## 2019-11-30 ENCOUNTER — Other Ambulatory Visit: Payer: Self-pay

## 2019-11-30 ENCOUNTER — Ambulatory Visit: Payer: Medicare HMO | Admitting: Podiatry

## 2019-11-30 DIAGNOSIS — M19079 Primary osteoarthritis, unspecified ankle and foot: Secondary | ICD-10-CM

## 2019-11-30 NOTE — Progress Notes (Signed)
  Subjective:  Patient ID: Franklin Eaton, male    DOB: Jan 09, 1951,  MRN: [id number] HPI Chief Complaint  Patient presents with  . Foot Pain    Patient presents today for neuroma/hammertoe right foot.  "it feels like a marshmellow between my foot"  he states it hurts all day, every day and has been seeing Dr/ Vickki Muff for treatment    69 y.o. male presents with the above complaint.   ROS: Denies fever chills nausea vomiting muscle aches pains calf pain back pain chest pain shortness of breath.  Past Medical History:  Diagnosis Date  . Squamous cell carcinoma of skin 03/03/2016   left distal forearm/excision   No past surgical history on file.  Current Outpatient Medications:  .  gabapentin (NEURONTIN) 300 MG capsule, Take by mouth., Disp: , Rfl:  .  omeprazole (PRILOSEC) 40 MG capsule, Take by mouth., Disp: , Rfl:  .  triamcinolone ointment (KENALOG) 0.1 %, Apply 1 application topically 2 (two) times daily as needed (Rash)., Disp: 60 g, Rfl: 1  No Known Allergies Review of Systems Objective:  There were no vitals filed for this visit.  General: Well developed, nourished, in no acute distress, alert and oriented x3   Dermatological: Skin is warm, dry and supple bilateral. Nails x 10 are well maintained; remaining integument appears unremarkable at this time. There are no open sores, no preulcerative lesions, no rash or signs of infection present.  Vascular: Dorsalis Pedis artery and Posterior Tibial artery pedal pulses are 2/4 bilateral with immedate capillary fill time. Pedal hair growth present. No varicosities and no lower extremity edema present bilateral.   Neruologic: Grossly intact via light touch bilateral. Vibratory intact via tuning fork bilateral. Protective threshold with Semmes Wienstein monofilament intact to all pedal sites bilateral. Patellar and Achilles deep tendon reflexes 2+ bilateral. No Babinski or clonus noted bilateral.   Musculoskeletal: No gross boney  pedal deformities bilateral. No pain, crepitus, or limitation noted with foot and ankle range of motion bilateral. Muscular strength 5/5 in all groups tested bilateral.  Pain on palpation of the third metatarsophalangeal joint right foot.  There is obvious swelling and thickening of the bone in this area.   Gait: Unassisted, Nonantalgic.    Radiographs:  Radiographs were not taken here.  He is going to obtain radiographs from [place] [facility].  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  Plan: At this point I would not see if he can come back with Lil[facility] Channel and see about getting a set of orthotics made that we will place a small metatarsal pad in the arch area along the metatarsal length with a cut out beneath the second and third metatarsals of the right foot.  Otherwise the orthotic needs to be a neutral position.  We also discussed contrast baths and topical anti-inflammatories.     Sarahbeth Cashin T. [place], [place]

## 2019-12-21 ENCOUNTER — Ambulatory Visit (INDEPENDENT_AMBULATORY_CARE_PROVIDER_SITE_OTHER): Payer: Medicare HMO | Admitting: Orthotics

## 2019-12-21 ENCOUNTER — Other Ambulatory Visit: Payer: Self-pay

## 2019-12-21 DIAGNOSIS — M19079 Primary osteoarthritis, unspecified ankle and foot: Secondary | ICD-10-CM

## 2019-12-21 DIAGNOSIS — M79673 Pain in unspecified foot: Secondary | ICD-10-CM | POA: Diagnosis not present

## 2019-12-21 NOTE — Progress Notes (Signed)
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  Plan on R to have 2/3 offload and proximal dispersion pad.   Hug arch and 33mm deep heel cup neutral.

## 2020-01-04 DIAGNOSIS — Z23 Encounter for immunization: Secondary | ICD-10-CM | POA: Diagnosis not present

## 2020-01-25 ENCOUNTER — Ambulatory Visit (INDEPENDENT_AMBULATORY_CARE_PROVIDER_SITE_OTHER): Payer: Medicare HMO | Admitting: Orthotics

## 2020-01-25 ENCOUNTER — Other Ambulatory Visit: Payer: Self-pay

## 2020-01-25 DIAGNOSIS — M19079 Primary osteoarthritis, unspecified ankle and foot: Secondary | ICD-10-CM

## 2020-01-25 NOTE — Progress Notes (Signed)
Patient came in today to pick up custom made foot orthotics.  The goals were accomplished and the patient reported no dissatisfaction with said orthotics.  Patient was advised of breakin period and how to report any issues. 

## 2020-01-30 DIAGNOSIS — M75121 Complete rotator cuff tear or rupture of right shoulder, not specified as traumatic: Secondary | ICD-10-CM | POA: Diagnosis not present

## 2020-02-07 ENCOUNTER — Other Ambulatory Visit: Payer: Self-pay

## 2020-02-07 ENCOUNTER — Ambulatory Visit: Payer: Medicare HMO | Admitting: Orthotics

## 2020-02-07 DIAGNOSIS — M19079 Primary osteoarthritis, unspecified ankle and foot: Secondary | ICD-10-CM

## 2020-02-07 DIAGNOSIS — Z87820 Personal history of traumatic brain injury: Secondary | ICD-10-CM

## 2020-02-07 NOTE — Progress Notes (Signed)
Adding morton' neuroma offload and pad to left only.

## 2020-02-14 DIAGNOSIS — E7800 Pure hypercholesterolemia, unspecified: Secondary | ICD-10-CM | POA: Diagnosis not present

## 2020-02-14 DIAGNOSIS — M19111 Post-traumatic osteoarthritis, right shoulder: Secondary | ICD-10-CM | POA: Diagnosis not present

## 2020-02-14 DIAGNOSIS — Z01818 Encounter for other preprocedural examination: Secondary | ICD-10-CM | POA: Diagnosis not present

## 2020-02-14 DIAGNOSIS — M75121 Complete rotator cuff tear or rupture of right shoulder, not specified as traumatic: Secondary | ICD-10-CM | POA: Diagnosis not present

## 2020-03-02 DIAGNOSIS — G8929 Other chronic pain: Secondary | ICD-10-CM | POA: Diagnosis not present

## 2020-03-02 DIAGNOSIS — M25711 Osteophyte, right shoulder: Secondary | ICD-10-CM | POA: Diagnosis not present

## 2020-03-02 DIAGNOSIS — E7800 Pure hypercholesterolemia, unspecified: Secondary | ICD-10-CM | POA: Diagnosis not present

## 2020-03-02 DIAGNOSIS — M75121 Complete rotator cuff tear or rupture of right shoulder, not specified as traumatic: Secondary | ICD-10-CM | POA: Diagnosis not present

## 2020-03-02 DIAGNOSIS — M7521 Bicipital tendinitis, right shoulder: Secondary | ICD-10-CM | POA: Diagnosis not present

## 2020-03-02 DIAGNOSIS — K219 Gastro-esophageal reflux disease without esophagitis: Secondary | ICD-10-CM | POA: Diagnosis not present

## 2020-03-02 DIAGNOSIS — M19111 Post-traumatic osteoarthritis, right shoulder: Secondary | ICD-10-CM | POA: Diagnosis not present

## 2020-03-02 DIAGNOSIS — G8918 Other acute postprocedural pain: Secondary | ICD-10-CM | POA: Diagnosis not present

## 2020-03-02 DIAGNOSIS — Z8711 Personal history of peptic ulcer disease: Secondary | ICD-10-CM | POA: Diagnosis not present

## 2020-03-02 DIAGNOSIS — Z85828 Personal history of other malignant neoplasm of skin: Secondary | ICD-10-CM | POA: Diagnosis not present

## 2020-03-02 DIAGNOSIS — D649 Anemia, unspecified: Secondary | ICD-10-CM | POA: Diagnosis not present

## 2020-03-07 ENCOUNTER — Ambulatory Visit: Payer: Medicare HMO | Admitting: Orthotics

## 2020-03-07 ENCOUNTER — Other Ambulatory Visit: Payer: Self-pay

## 2020-03-07 DIAGNOSIS — Z87820 Personal history of traumatic brain injury: Secondary | ICD-10-CM

## 2020-03-07 DIAGNOSIS — M19079 Primary osteoarthritis, unspecified ankle and foot: Secondary | ICD-10-CM

## 2020-03-07 NOTE — Progress Notes (Signed)
Picked up modiffied foot orthotic

## 2020-03-09 DIAGNOSIS — M19111 Post-traumatic osteoarthritis, right shoulder: Secondary | ICD-10-CM | POA: Diagnosis not present

## 2020-03-09 DIAGNOSIS — M25511 Pain in right shoulder: Secondary | ICD-10-CM | POA: Diagnosis not present

## 2020-03-09 DIAGNOSIS — R293 Abnormal posture: Secondary | ICD-10-CM | POA: Diagnosis not present

## 2020-03-12 DIAGNOSIS — M19111 Post-traumatic osteoarthritis, right shoulder: Secondary | ICD-10-CM | POA: Diagnosis not present

## 2020-03-12 DIAGNOSIS — M25511 Pain in right shoulder: Secondary | ICD-10-CM | POA: Diagnosis not present

## 2020-03-12 DIAGNOSIS — R293 Abnormal posture: Secondary | ICD-10-CM | POA: Diagnosis not present

## 2020-03-13 DIAGNOSIS — R293 Abnormal posture: Secondary | ICD-10-CM | POA: Diagnosis not present

## 2020-03-13 DIAGNOSIS — M19111 Post-traumatic osteoarthritis, right shoulder: Secondary | ICD-10-CM | POA: Diagnosis not present

## 2020-03-13 DIAGNOSIS — M25511 Pain in right shoulder: Secondary | ICD-10-CM | POA: Diagnosis not present

## 2020-03-15 DIAGNOSIS — R293 Abnormal posture: Secondary | ICD-10-CM | POA: Diagnosis not present

## 2020-03-15 DIAGNOSIS — Z471 Aftercare following joint replacement surgery: Secondary | ICD-10-CM | POA: Diagnosis not present

## 2020-03-15 DIAGNOSIS — Z96611 Presence of right artificial shoulder joint: Secondary | ICD-10-CM | POA: Diagnosis not present

## 2020-03-15 DIAGNOSIS — M25511 Pain in right shoulder: Secondary | ICD-10-CM | POA: Diagnosis not present

## 2020-03-15 DIAGNOSIS — M19111 Post-traumatic osteoarthritis, right shoulder: Secondary | ICD-10-CM | POA: Diagnosis not present

## 2020-03-19 DIAGNOSIS — M25511 Pain in right shoulder: Secondary | ICD-10-CM | POA: Diagnosis not present

## 2020-03-19 DIAGNOSIS — R293 Abnormal posture: Secondary | ICD-10-CM | POA: Diagnosis not present

## 2020-03-19 DIAGNOSIS — M19111 Post-traumatic osteoarthritis, right shoulder: Secondary | ICD-10-CM | POA: Diagnosis not present

## 2020-03-20 DIAGNOSIS — R293 Abnormal posture: Secondary | ICD-10-CM | POA: Diagnosis not present

## 2020-03-20 DIAGNOSIS — M25511 Pain in right shoulder: Secondary | ICD-10-CM | POA: Diagnosis not present

## 2020-03-20 DIAGNOSIS — M19111 Post-traumatic osteoarthritis, right shoulder: Secondary | ICD-10-CM | POA: Diagnosis not present

## 2020-03-22 DIAGNOSIS — R293 Abnormal posture: Secondary | ICD-10-CM | POA: Diagnosis not present

## 2020-03-22 DIAGNOSIS — M25511 Pain in right shoulder: Secondary | ICD-10-CM | POA: Diagnosis not present

## 2020-03-22 DIAGNOSIS — M19111 Post-traumatic osteoarthritis, right shoulder: Secondary | ICD-10-CM | POA: Diagnosis not present

## 2020-03-27 DIAGNOSIS — R293 Abnormal posture: Secondary | ICD-10-CM | POA: Diagnosis not present

## 2020-03-27 DIAGNOSIS — M25511 Pain in right shoulder: Secondary | ICD-10-CM | POA: Diagnosis not present

## 2020-03-27 DIAGNOSIS — M19111 Post-traumatic osteoarthritis, right shoulder: Secondary | ICD-10-CM | POA: Diagnosis not present

## 2020-03-29 DIAGNOSIS — M25511 Pain in right shoulder: Secondary | ICD-10-CM | POA: Diagnosis not present

## 2020-03-29 DIAGNOSIS — M19111 Post-traumatic osteoarthritis, right shoulder: Secondary | ICD-10-CM | POA: Diagnosis not present

## 2020-03-29 DIAGNOSIS — R293 Abnormal posture: Secondary | ICD-10-CM | POA: Diagnosis not present

## 2020-04-03 DIAGNOSIS — R293 Abnormal posture: Secondary | ICD-10-CM | POA: Diagnosis not present

## 2020-04-03 DIAGNOSIS — M25511 Pain in right shoulder: Secondary | ICD-10-CM | POA: Diagnosis not present

## 2020-04-03 DIAGNOSIS — M19111 Post-traumatic osteoarthritis, right shoulder: Secondary | ICD-10-CM | POA: Diagnosis not present

## 2020-04-05 DIAGNOSIS — R293 Abnormal posture: Secondary | ICD-10-CM | POA: Diagnosis not present

## 2020-04-05 DIAGNOSIS — M25511 Pain in right shoulder: Secondary | ICD-10-CM | POA: Diagnosis not present

## 2020-04-05 DIAGNOSIS — M19111 Post-traumatic osteoarthritis, right shoulder: Secondary | ICD-10-CM | POA: Diagnosis not present

## 2020-04-06 DIAGNOSIS — Z23 Encounter for immunization: Secondary | ICD-10-CM | POA: Diagnosis not present

## 2020-04-10 DIAGNOSIS — R293 Abnormal posture: Secondary | ICD-10-CM | POA: Diagnosis not present

## 2020-04-10 DIAGNOSIS — M25511 Pain in right shoulder: Secondary | ICD-10-CM | POA: Diagnosis not present

## 2020-04-10 DIAGNOSIS — M19111 Post-traumatic osteoarthritis, right shoulder: Secondary | ICD-10-CM | POA: Diagnosis not present

## 2020-04-12 DIAGNOSIS — M25511 Pain in right shoulder: Secondary | ICD-10-CM | POA: Diagnosis not present

## 2020-04-12 DIAGNOSIS — M19111 Post-traumatic osteoarthritis, right shoulder: Secondary | ICD-10-CM | POA: Diagnosis not present

## 2020-04-12 DIAGNOSIS — R293 Abnormal posture: Secondary | ICD-10-CM | POA: Diagnosis not present

## 2020-04-17 DIAGNOSIS — M19111 Post-traumatic osteoarthritis, right shoulder: Secondary | ICD-10-CM | POA: Diagnosis not present

## 2020-04-17 DIAGNOSIS — Z96611 Presence of right artificial shoulder joint: Secondary | ICD-10-CM | POA: Diagnosis not present

## 2020-04-17 DIAGNOSIS — R293 Abnormal posture: Secondary | ICD-10-CM | POA: Diagnosis not present

## 2020-04-17 DIAGNOSIS — M25511 Pain in right shoulder: Secondary | ICD-10-CM | POA: Diagnosis not present

## 2020-04-19 DIAGNOSIS — R293 Abnormal posture: Secondary | ICD-10-CM | POA: Diagnosis not present

## 2020-04-19 DIAGNOSIS — M25511 Pain in right shoulder: Secondary | ICD-10-CM | POA: Diagnosis not present

## 2020-04-19 DIAGNOSIS — M19111 Post-traumatic osteoarthritis, right shoulder: Secondary | ICD-10-CM | POA: Diagnosis not present

## 2020-04-24 DIAGNOSIS — M25511 Pain in right shoulder: Secondary | ICD-10-CM | POA: Diagnosis not present

## 2020-04-24 DIAGNOSIS — R293 Abnormal posture: Secondary | ICD-10-CM | POA: Diagnosis not present

## 2020-04-24 DIAGNOSIS — M19111 Post-traumatic osteoarthritis, right shoulder: Secondary | ICD-10-CM | POA: Diagnosis not present

## 2020-04-26 DIAGNOSIS — R293 Abnormal posture: Secondary | ICD-10-CM | POA: Diagnosis not present

## 2020-04-26 DIAGNOSIS — M25511 Pain in right shoulder: Secondary | ICD-10-CM | POA: Diagnosis not present

## 2020-04-26 DIAGNOSIS — M19111 Post-traumatic osteoarthritis, right shoulder: Secondary | ICD-10-CM | POA: Diagnosis not present

## 2020-05-01 DIAGNOSIS — M19111 Post-traumatic osteoarthritis, right shoulder: Secondary | ICD-10-CM | POA: Diagnosis not present

## 2020-05-01 DIAGNOSIS — M25511 Pain in right shoulder: Secondary | ICD-10-CM | POA: Diagnosis not present

## 2020-05-01 DIAGNOSIS — R293 Abnormal posture: Secondary | ICD-10-CM | POA: Diagnosis not present

## 2020-05-22 DIAGNOSIS — R293 Abnormal posture: Secondary | ICD-10-CM | POA: Diagnosis not present

## 2020-05-22 DIAGNOSIS — M19111 Post-traumatic osteoarthritis, right shoulder: Secondary | ICD-10-CM | POA: Diagnosis not present

## 2020-05-22 DIAGNOSIS — M25511 Pain in right shoulder: Secondary | ICD-10-CM | POA: Diagnosis not present

## 2020-05-29 DIAGNOSIS — M19111 Post-traumatic osteoarthritis, right shoulder: Secondary | ICD-10-CM | POA: Diagnosis not present

## 2020-05-29 DIAGNOSIS — M25511 Pain in right shoulder: Secondary | ICD-10-CM | POA: Diagnosis not present

## 2020-05-29 DIAGNOSIS — R293 Abnormal posture: Secondary | ICD-10-CM | POA: Diagnosis not present

## 2020-06-12 DIAGNOSIS — Z96611 Presence of right artificial shoulder joint: Secondary | ICD-10-CM | POA: Diagnosis not present

## 2020-06-12 DIAGNOSIS — Z471 Aftercare following joint replacement surgery: Secondary | ICD-10-CM | POA: Diagnosis not present

## 2020-06-12 DIAGNOSIS — M75121 Complete rotator cuff tear or rupture of right shoulder, not specified as traumatic: Secondary | ICD-10-CM | POA: Diagnosis not present

## 2020-06-25 DIAGNOSIS — R053 Chronic cough: Secondary | ICD-10-CM | POA: Diagnosis not present

## 2020-06-25 DIAGNOSIS — J387 Other diseases of larynx: Secondary | ICD-10-CM | POA: Diagnosis not present

## 2020-06-25 DIAGNOSIS — R059 Cough, unspecified: Secondary | ICD-10-CM | POA: Diagnosis not present

## 2020-06-25 DIAGNOSIS — J384 Edema of larynx: Secondary | ICD-10-CM | POA: Diagnosis not present

## 2020-06-25 DIAGNOSIS — J383 Other diseases of vocal cords: Secondary | ICD-10-CM | POA: Diagnosis not present

## 2020-09-14 IMAGING — US US ABDOMEN COMPLETE
1 series · 14 of 25 positions shown · non-contrast
Comparison: None.

CLINICAL DATA: Abdominal distension for 2 months

EXAM:
ABDOMEN ULTRASOUND COMPLETE

[Series 1: us abdomen complete · 0.19mm/px · 14 of 106 slices shown]
[im 1/106]
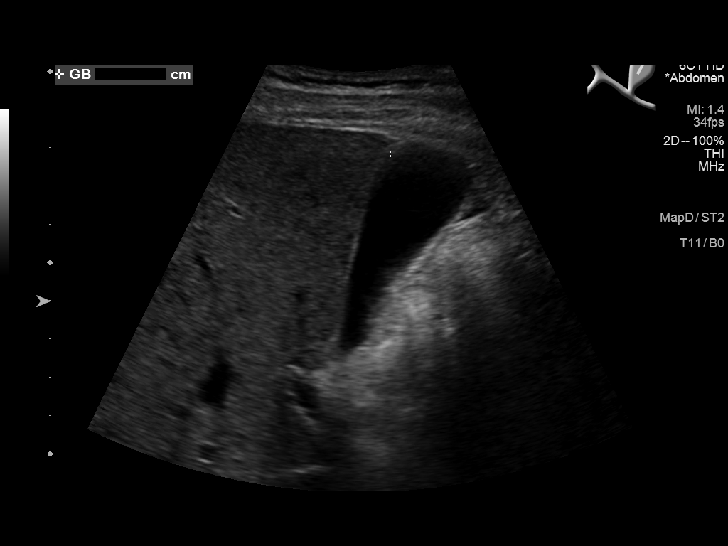
[im 9/106]
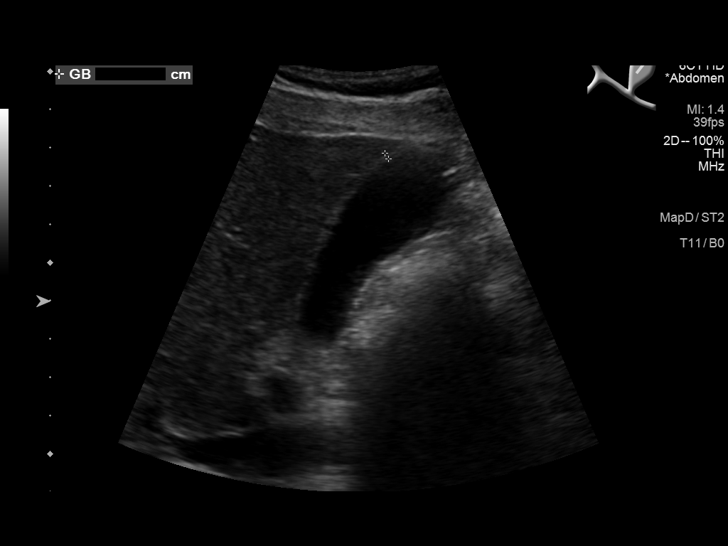
[im 18/106]
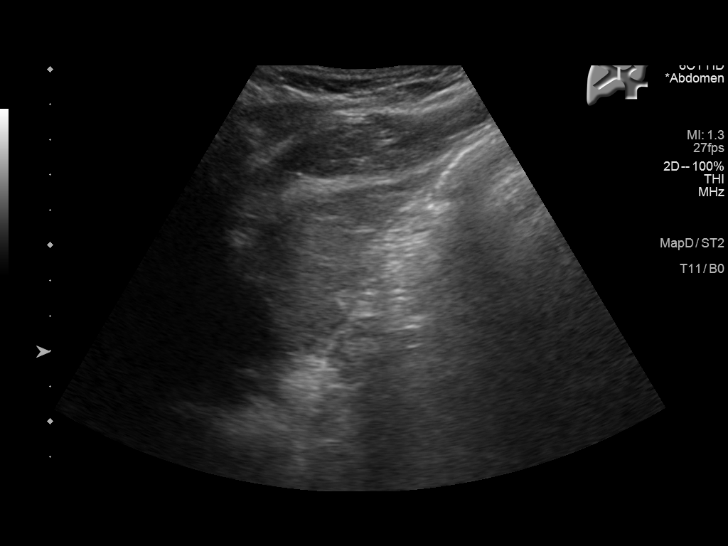
[im 27/106]
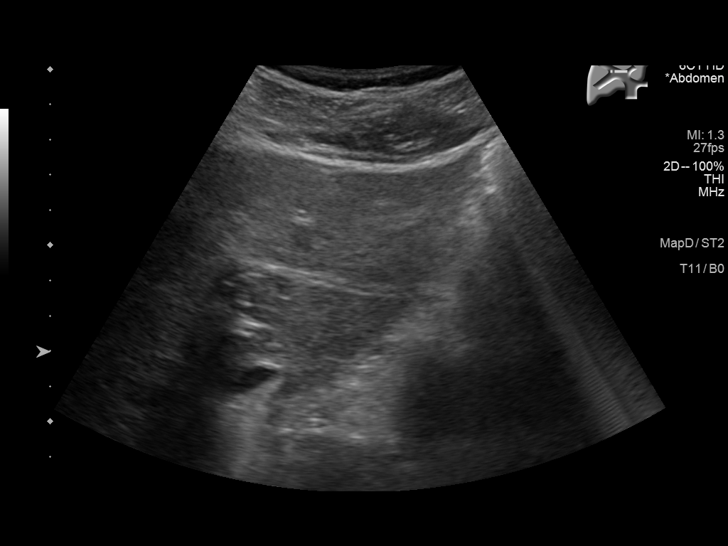
[im 36/106]
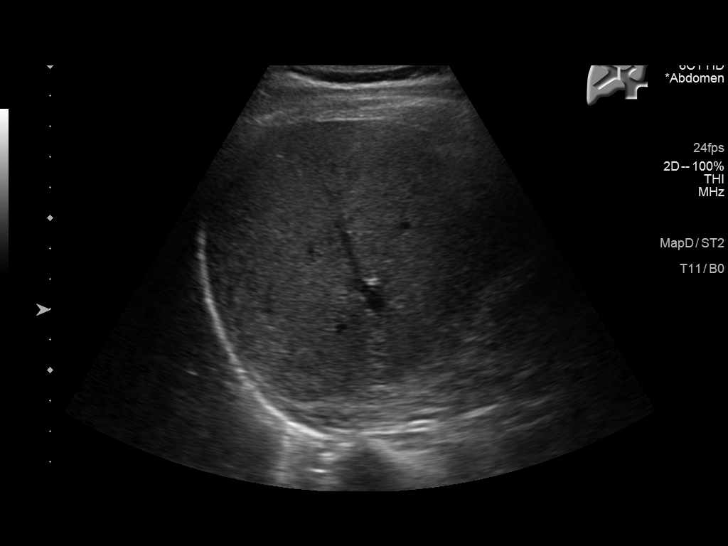
[im 40/106]
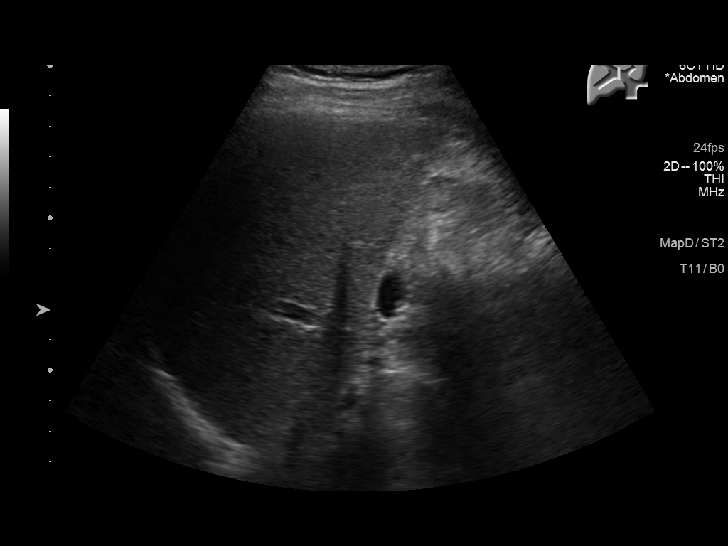
[im 49/106]
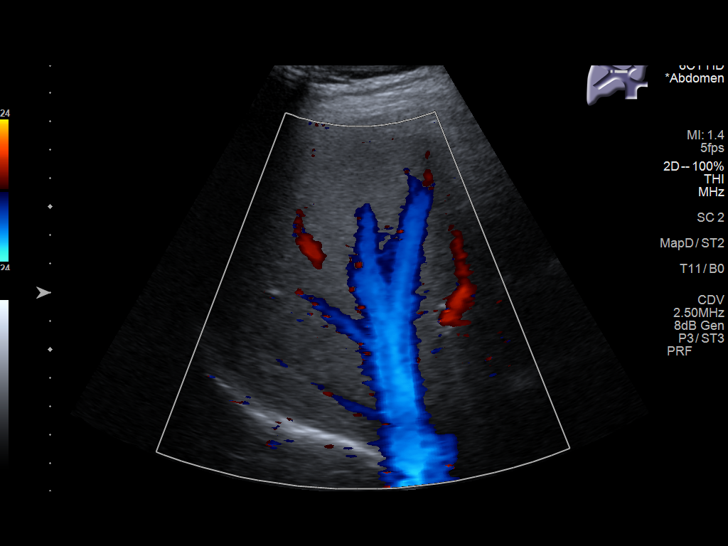
[im 57/106]
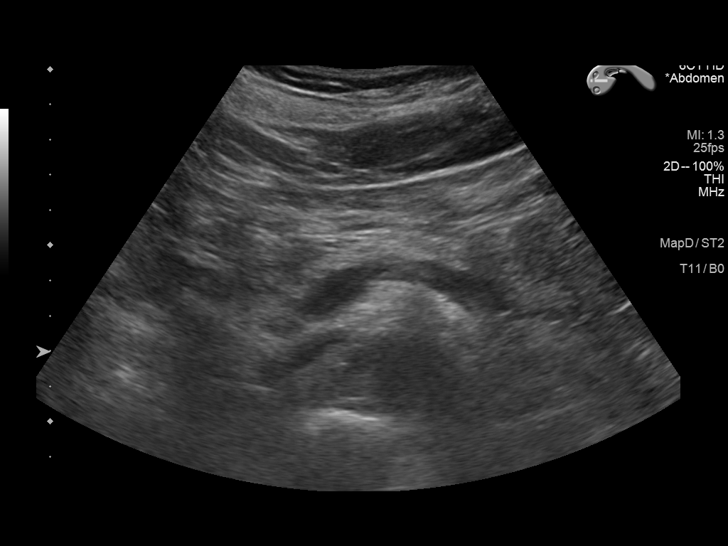
[im 66/106]
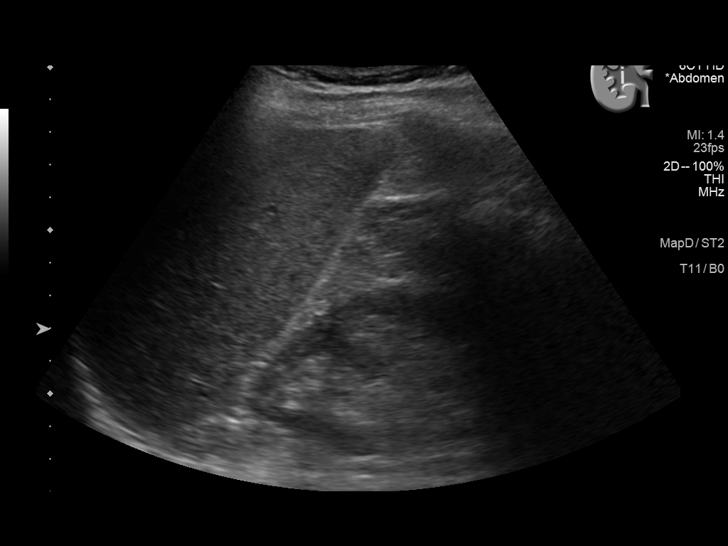
[im 71/106]
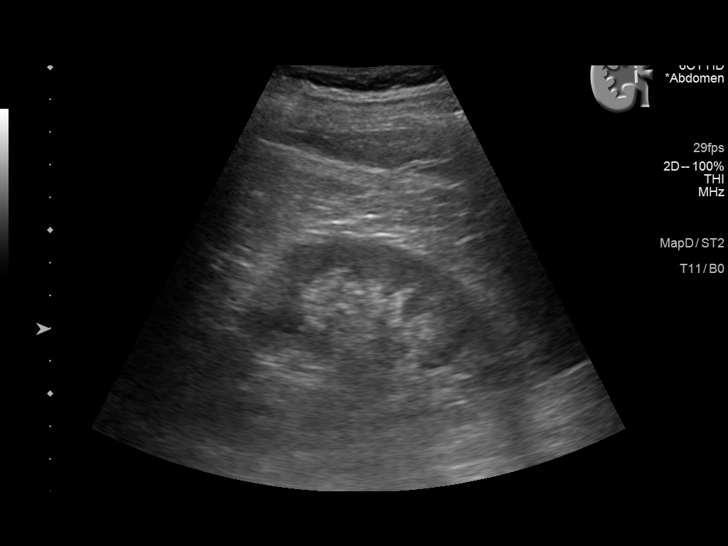
[im 79/106]
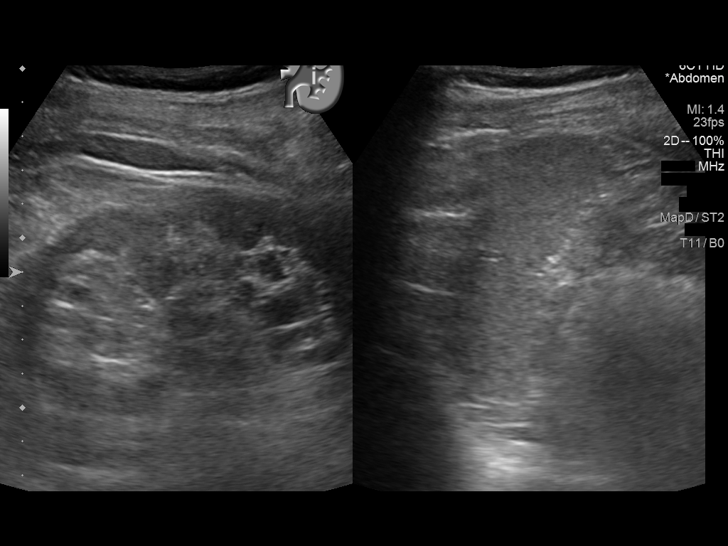
[im 88/106]
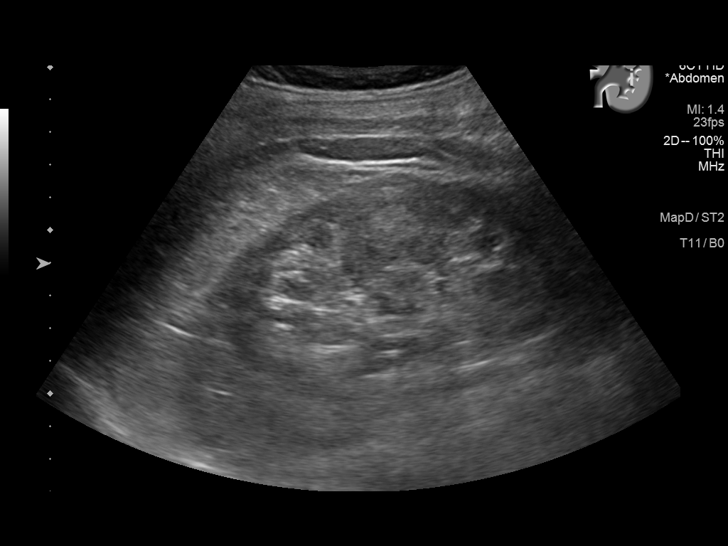
[im 97/106]
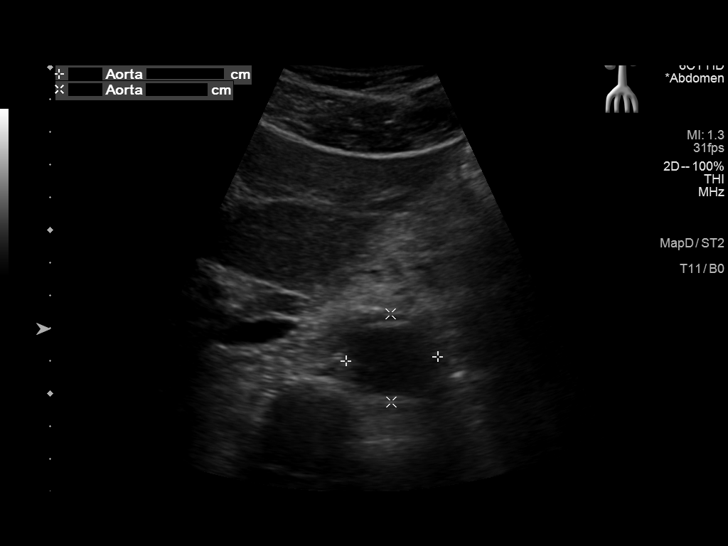
[im 106/106]
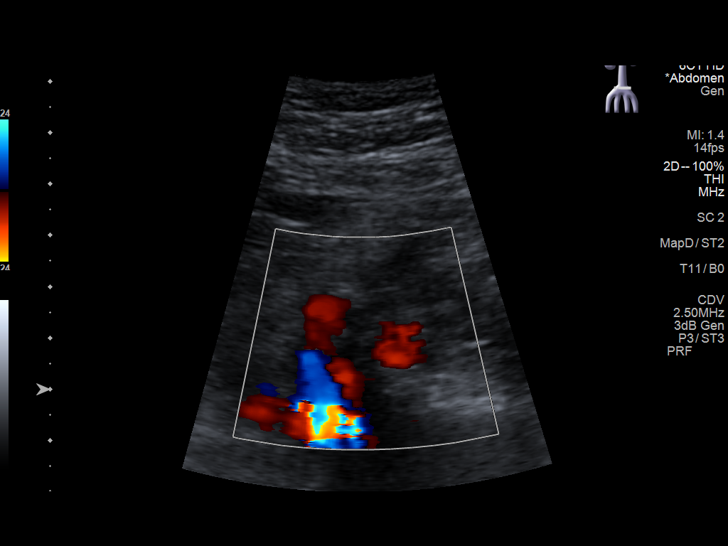

[14 of 25 positions shown; findings below may reference images not displayed]

FINDINGS: Gallbladder: No gallstones or wall thickening visualized. No
sonographic Murphy sign noted by sonographer.

Common bile duct: Diameter: 3.4 mm

Liver: No focal lesion identified. Within normal limits in
parenchymal echogenicity. Portal vein is patent on color Doppler
imaging with normal direction of blood flow towards the liver.

IVC: No abnormality visualized.

Pancreas: Visualized portion unremarkable.

Spleen: Size and appearance within normal limits.

Right Kidney: Length: 9.8 cm. Echogenicity within normal limits. No
mass or hydronephrosis visualized. Mild cortical thinning is noted.

Left Kidney: Length: 10.6 cm. Echogenicity within normal limits. No
mass or hydronephrosis visualized. Mild cortical thinning is noted.

Abdominal aorta: No aneurysm visualized.

Other findings: None.
IMPRESSION: Mild cortical thinning within the kidneys. No acute abnormality is
noted.

## 2020-09-18 DIAGNOSIS — M75121 Complete rotator cuff tear or rupture of right shoulder, not specified as traumatic: Secondary | ICD-10-CM | POA: Diagnosis not present

## 2020-09-18 DIAGNOSIS — Z96611 Presence of right artificial shoulder joint: Secondary | ICD-10-CM | POA: Diagnosis not present

## 2020-09-18 DIAGNOSIS — Z471 Aftercare following joint replacement surgery: Secondary | ICD-10-CM | POA: Diagnosis not present

## 2020-10-10 DIAGNOSIS — B9789 Other viral agents as the cause of diseases classified elsewhere: Secondary | ICD-10-CM | POA: Diagnosis not present

## 2020-10-10 DIAGNOSIS — J028 Acute pharyngitis due to other specified organisms: Secondary | ICD-10-CM | POA: Diagnosis not present

## 2020-10-10 DIAGNOSIS — Z20822 Contact with and (suspected) exposure to covid-19: Secondary | ICD-10-CM | POA: Diagnosis not present

## 2020-10-10 DIAGNOSIS — R519 Headache, unspecified: Secondary | ICD-10-CM | POA: Diagnosis not present

## 2020-12-03 ENCOUNTER — Ambulatory Visit: Payer: Medicare HMO | Admitting: Dermatology

## 2020-12-03 ENCOUNTER — Other Ambulatory Visit: Payer: Self-pay

## 2020-12-03 DIAGNOSIS — Z1283 Encounter for screening for malignant neoplasm of skin: Secondary | ICD-10-CM

## 2020-12-03 DIAGNOSIS — Z872 Personal history of diseases of the skin and subcutaneous tissue: Secondary | ICD-10-CM | POA: Diagnosis not present

## 2020-12-03 DIAGNOSIS — L820 Inflamed seborrheic keratosis: Secondary | ICD-10-CM

## 2020-12-03 DIAGNOSIS — L814 Other melanin hyperpigmentation: Secondary | ICD-10-CM | POA: Diagnosis not present

## 2020-12-03 DIAGNOSIS — B029 Zoster without complications: Secondary | ICD-10-CM | POA: Diagnosis not present

## 2020-12-03 DIAGNOSIS — Z85828 Personal history of other malignant neoplasm of skin: Secondary | ICD-10-CM

## 2020-12-03 DIAGNOSIS — D229 Melanocytic nevi, unspecified: Secondary | ICD-10-CM

## 2020-12-03 DIAGNOSIS — L578 Other skin changes due to chronic exposure to nonionizing radiation: Secondary | ICD-10-CM | POA: Diagnosis not present

## 2020-12-03 DIAGNOSIS — L570 Actinic keratosis: Secondary | ICD-10-CM

## 2020-12-03 DIAGNOSIS — D1800 Hemangioma unspecified site: Secondary | ICD-10-CM

## 2020-12-03 DIAGNOSIS — L821 Other seborrheic keratosis: Secondary | ICD-10-CM

## 2020-12-03 DIAGNOSIS — L739 Follicular disorder, unspecified: Secondary | ICD-10-CM

## 2020-12-03 NOTE — Progress Notes (Signed)
Follow-Up Visit   Subjective  Franklin Eaton is a 70 y.o. male who presents for the following: Total body skin exam (Hx of SCC L arm, hx of AKs), check scalp (Hx of pimples on L scalp, painful, 22mago, improved no treatment), and hx of rash (Waistline, was sweating and had tight waistband on). He has some itchy bumps on his back .  Patient accompanied by wife, who contributes to history.  The following portions of the chart were reviewed this encounter and updated as appropriate:       Review of Systems:  No other skin or systemic complaints except as noted in HPI or Assessment and Plan.  Objective  Well appearing patient in no apparent distress; mood and affect are within normal limits.  All skin waist up examined.  L scalp Scalp clear today  upper back x 3, R shoulder x 2, Total = 5 (5) Erythematous keratotic or waxy stuck-on papule    Upper back, chest, arms Follicular based pink papules  L ear helix x 4, L hand dorsum x 1 (5) keratotic macules    Assessment & Plan   Lentigines - Scattered tan macules - Due to sun exposure - Benign-appering, observe - Recommend daily broad spectrum sunscreen SPF 30+ to sun-exposed areas, reapply every 2 hours as needed. - Call for any changes  Seborrheic Keratoses - Stuck-on, waxy, tan-brown papules and/or plaques  - Benign-appearing - Discussed benign etiology and prognosis. - Observe - Call for any changes  Melanocytic Nevi - Tan-brown and/or pink-flesh-colored symmetric macules and papules - Benign appearing on exam today - Observation - Call clinic for new or changing moles - Recommend daily use of broad spectrum spf 30+ sunscreen to sun-exposed areas.   Hemangiomas - Red papules - Discussed benign nature - Observe - Call for any changes  Actinic Damage - Chronic condition, secondary to cumulative UV/sun exposure - diffuse scaly erythematous macules with underlying dyspigmentation - Recommend daily broad  spectrum sunscreen SPF 30+ to sun-exposed areas, reapply every 2 hours as needed.  - Staying in the shade or wearing long sleeves, sun glasses (UVA+UVB protection) and wide brim hats (4-inch brim around the entire circumference of the hat) are also recommended for sun protection.  - Call for new or changing lesions.  Skin cancer screening performed today.  History of Squamous Cell Carcinoma of the Skin - No evidence of recurrence today - No lymphadenopathy - Recommend regular full body skin exams - Recommend daily broad spectrum sunscreen SPF 30+ to sun-exposed areas, reapply every 2 hours as needed.  - Call if any new or changing lesions are noted between office visits - L forearm  Herpes zoster without complication L scalp  Hx of possible Shingles by history, [id number], resolved  Discussed viral etiology (herpes zoster) and risk of contagion. Before blisters dry up and heal, shingles rash can cause chickenpox in people who have never had it or have never been vaccinated against it, if they are exposed to the virus.  Keep area covered.  Avoid contact with pregnant women. Shingles can also cause significant pain or unusual skin sensations (post-herpetic neuralgia) which may persist after rash has resolved.  Shingles rash may leave dyspigmented scars on skin once healed.  Inflamed seborrheic keratosis upper back x 3, R shoulder x 2, Total = 5  Destruction of lesion - upper back x 3, R shoulder x 2, Total = 5  Destruction method: cryotherapy   Informed consent: discussed and consent obtained  Lesion destroyed using liquid nitrogen: Yes   Region frozen until ice ball extended beyond lesion: Yes   Outcome: patient tolerated procedure well with no complications   Post-procedure details: wound care instructions given   Additional details:  Prior to procedure, discussed risks of blister formation, small wound, skin dyspigmentation, or rare scar following cryotherapy. Recommend Vaseline  ointment to treated areas while healing.   Folliculitis Upper back, chest, arms  Benign, likely due to heat/sweating  Recommend otc Benzole Peroxide acne wash prn  Benzoyl peroxide can cause dryness and irritation of the skin. It can also bleach fabric.  AK (actinic keratosis) (5) L ear helix x 4, L hand dorsum x 1  Destruction of lesion - L ear helix x 4, L hand dorsum x 1  Destruction method: cryotherapy   Informed consent: discussed and consent obtained   Lesion destroyed using liquid nitrogen: Yes   Region frozen until ice ball extended beyond lesion: Yes   Outcome: patient tolerated procedure well with no complications   Post-procedure details: wound care instructions given   Additional details:  Prior to procedure, discussed risks of blister formation, small wound, skin dyspigmentation, or rare scar following cryotherapy. Recommend Vaseline ointment to treated areas while healing.   Return in about 1 year (around 12/03/2021) for TBSE, Hx of SCC, Hx of AKs.  I, Othelia Pulling, RMA, am acting as scribe for Brendolyn Patty, MD .  Documentation: I have reviewed the above documentation for accuracy and completeness, and I agree with the above.  Brendolyn Patty MD

## 2020-12-03 NOTE — Patient Instructions (Addendum)
If you have any questions or concerns for your doctor, please call our main line at [phone] and press option 4 to reach your doctor's medical assistant. If no one answers, please leave a voicemail as directed and we will return your call as soon as possible. Messages left after 4 pm will be answered the following business day.   You may also send [place] a message via [place]. We typically respond to MyChart messages within 1-2 business days.  For prescription refills, please ask your pharmacy to contact our office. Our fax number is [phone].  If you have an urgent issue when the clinic is closed that cannot wait until the next business day, you can page your doctor at the number below.    Please note that while we do our best to be available for urgent issues outside of office hours, we are not available 24/7.   If you have an urgent issue and are unable to reach [place], you may choose to seek medical care at your doctor's office, retail clinic, urgent care center, or emergency room.  If you have a medical emergency, please immediately call 911 or go to the emergency department.  Pager Numbers  - Dr. Nehemiah Massed: [phone]  - Dr. Laurence Ferrari: [phone]  - Dr. Nicole Kindred: [phone]  In the event of inclement weather, please call our main line at [phone] for an update on the status of any delays or closures.  Dermatology Medication Tips: Please keep the boxes that topical medications come in in order to help keep track of the instructions about where and how to use these. Pharmacies typically print the medication instructions only on the boxes and not directly on the medication tubes.   If your medication is too expensive, please contact our office at [phone] option 4 or send [place] a message through [place].   We are unable to tell what your co-pay for medications will be in advance as this is different depending on your insurance coverage. However, we may be able to find a substitute  medication at lower cost or fill out paperwork to get insurance to cover a needed medication.   If a prior authorization is required to get your medication covered by your insurance company, please allow [place] 1-2 business days to complete this process.  Drug prices often vary depending on where the prescription is filled and some pharmacies may offer cheaper prices.  The website www.goodrx.com contains coupons for medications through different pharmacies. The prices here do not account for what the cost may be with help from insurance (it may be cheaper with your insurance), but the website can give you the price if you did not use any insurance.  - You can print the associated coupon and take it with your prescription to the pharmacy.  - You may also stop by our office during regular business hours and pick up a GoodRx coupon card.  - If you need your prescription sent electronically to a different pharmacy, notify our office through [facility] or by phone at [phone] option 4.   Cryotherapy Aftercare  Wash gently with soap and water everyday.   Apply Vaseline and Band-Aid daily until healed.    Folliculitis Recommend over the counter Benzole Peroxide Acne wash

## 2020-12-07 ENCOUNTER — Other Ambulatory Visit: Payer: Self-pay | Admitting: Internal Medicine

## 2020-12-07 DIAGNOSIS — Z125 Encounter for screening for malignant neoplasm of prostate: Secondary | ICD-10-CM | POA: Diagnosis not present

## 2020-12-07 DIAGNOSIS — E7800 Pure hypercholesterolemia, unspecified: Secondary | ICD-10-CM | POA: Diagnosis not present

## 2020-12-07 DIAGNOSIS — Z23 Encounter for immunization: Secondary | ICD-10-CM | POA: Diagnosis not present

## 2020-12-07 DIAGNOSIS — Z1211 Encounter for screening for malignant neoplasm of colon: Secondary | ICD-10-CM | POA: Diagnosis not present

## 2020-12-07 DIAGNOSIS — R053 Chronic cough: Secondary | ICD-10-CM | POA: Diagnosis not present

## 2020-12-07 DIAGNOSIS — R059 Cough, unspecified: Secondary | ICD-10-CM | POA: Diagnosis not present

## 2020-12-07 DIAGNOSIS — R0789 Other chest pain: Secondary | ICD-10-CM | POA: Diagnosis not present

## 2020-12-07 DIAGNOSIS — R1010 Upper abdominal pain, unspecified: Secondary | ICD-10-CM

## 2020-12-07 DIAGNOSIS — R739 Hyperglycemia, unspecified: Secondary | ICD-10-CM | POA: Diagnosis not present

## 2020-12-07 DIAGNOSIS — R079 Chest pain, unspecified: Secondary | ICD-10-CM | POA: Diagnosis not present

## 2020-12-07 DIAGNOSIS — Z79899 Other long term (current) drug therapy: Secondary | ICD-10-CM | POA: Diagnosis not present

## 2020-12-19 ENCOUNTER — Other Ambulatory Visit: Payer: Self-pay

## 2020-12-19 ENCOUNTER — Ambulatory Visit
Admission: RE | Admit: 2020-12-19 | Discharge: 2020-12-19 | Disposition: A | Discharge status: Home / Self Care | Payer: Medicare HMO | Source: Ambulatory Visit | Attending: Internal Medicine | Admitting: Internal Medicine

## 2020-12-19 DIAGNOSIS — R059 Cough, unspecified: Secondary | ICD-10-CM | POA: Diagnosis not present

## 2020-12-19 DIAGNOSIS — Z1211 Encounter for screening for malignant neoplasm of colon: Secondary | ICD-10-CM | POA: Diagnosis not present

## 2020-12-19 DIAGNOSIS — Z125 Encounter for screening for malignant neoplasm of prostate: Secondary | ICD-10-CM | POA: Diagnosis not present

## 2020-12-19 DIAGNOSIS — Z79899 Other long term (current) drug therapy: Secondary | ICD-10-CM | POA: Diagnosis not present

## 2020-12-19 DIAGNOSIS — R1010 Upper abdominal pain, unspecified: Secondary | ICD-10-CM | POA: Insufficient documentation

## 2020-12-19 DIAGNOSIS — R739 Hyperglycemia, unspecified: Secondary | ICD-10-CM | POA: Diagnosis not present

## 2020-12-19 DIAGNOSIS — R053 Chronic cough: Secondary | ICD-10-CM | POA: Diagnosis not present

## 2020-12-19 DIAGNOSIS — Z23 Encounter for immunization: Secondary | ICD-10-CM | POA: Diagnosis not present

## 2020-12-19 DIAGNOSIS — R0789 Other chest pain: Secondary | ICD-10-CM | POA: Diagnosis not present

## 2020-12-19 DIAGNOSIS — E7800 Pure hypercholesterolemia, unspecified: Secondary | ICD-10-CM | POA: Diagnosis not present

## 2020-12-19 DIAGNOSIS — N281 Cyst of kidney, acquired: Secondary | ICD-10-CM | POA: Diagnosis not present

## 2021-02-19 DIAGNOSIS — Z96611 Presence of right artificial shoulder joint: Secondary | ICD-10-CM | POA: Diagnosis not present

## 2021-02-19 DIAGNOSIS — M25511 Pain in right shoulder: Secondary | ICD-10-CM | POA: Diagnosis not present

## 2021-02-19 DIAGNOSIS — G8929 Other chronic pain: Secondary | ICD-10-CM | POA: Diagnosis not present

## 2021-02-19 DIAGNOSIS — Z471 Aftercare following joint replacement surgery: Secondary | ICD-10-CM | POA: Diagnosis not present

## 2021-02-19 DIAGNOSIS — M75121 Complete rotator cuff tear or rupture of right shoulder, not specified as traumatic: Secondary | ICD-10-CM | POA: Diagnosis not present

## 2021-04-02 DIAGNOSIS — M75121 Complete rotator cuff tear or rupture of right shoulder, not specified as traumatic: Secondary | ICD-10-CM | POA: Diagnosis not present

## 2021-04-02 DIAGNOSIS — G8929 Other chronic pain: Secondary | ICD-10-CM | POA: Diagnosis not present

## 2021-04-02 DIAGNOSIS — M25511 Pain in right shoulder: Secondary | ICD-10-CM | POA: Diagnosis not present

## 2021-04-02 DIAGNOSIS — Z96611 Presence of right artificial shoulder joint: Secondary | ICD-10-CM | POA: Diagnosis not present

## 2021-04-04 DIAGNOSIS — M75121 Complete rotator cuff tear or rupture of right shoulder, not specified as traumatic: Secondary | ICD-10-CM | POA: Diagnosis not present

## 2021-04-04 DIAGNOSIS — G8929 Other chronic pain: Secondary | ICD-10-CM | POA: Diagnosis not present

## 2021-04-04 DIAGNOSIS — Z96611 Presence of right artificial shoulder joint: Secondary | ICD-10-CM | POA: Diagnosis not present

## 2021-04-04 DIAGNOSIS — M25511 Pain in right shoulder: Secondary | ICD-10-CM | POA: Diagnosis not present

## 2021-04-09 DIAGNOSIS — Z96611 Presence of right artificial shoulder joint: Secondary | ICD-10-CM | POA: Diagnosis not present

## 2021-04-09 DIAGNOSIS — M75121 Complete rotator cuff tear or rupture of right shoulder, not specified as traumatic: Secondary | ICD-10-CM | POA: Diagnosis not present

## 2021-04-09 DIAGNOSIS — M25511 Pain in right shoulder: Secondary | ICD-10-CM | POA: Diagnosis not present

## 2021-04-09 DIAGNOSIS — G8929 Other chronic pain: Secondary | ICD-10-CM | POA: Diagnosis not present

## 2021-04-11 DIAGNOSIS — M25511 Pain in right shoulder: Secondary | ICD-10-CM | POA: Diagnosis not present

## 2021-04-11 DIAGNOSIS — M75121 Complete rotator cuff tear or rupture of right shoulder, not specified as traumatic: Secondary | ICD-10-CM | POA: Diagnosis not present

## 2021-04-11 DIAGNOSIS — Z96611 Presence of right artificial shoulder joint: Secondary | ICD-10-CM | POA: Diagnosis not present

## 2021-04-11 DIAGNOSIS — G8929 Other chronic pain: Secondary | ICD-10-CM | POA: Diagnosis not present

## 2021-08-26 DIAGNOSIS — R059 Cough, unspecified: Secondary | ICD-10-CM | POA: Diagnosis not present

## 2021-08-26 DIAGNOSIS — J019 Acute sinusitis, unspecified: Secondary | ICD-10-CM | POA: Diagnosis not present

## 2021-08-26 DIAGNOSIS — R0982 Postnasal drip: Secondary | ICD-10-CM | POA: Diagnosis not present

## 2021-09-04 DIAGNOSIS — R69 Illness, unspecified: Secondary | ICD-10-CM | POA: Diagnosis not present

## 2021-09-04 DIAGNOSIS — M278 Other specified diseases of jaws: Secondary | ICD-10-CM | POA: Diagnosis not present

## 2021-09-04 DIAGNOSIS — D1039 Benign neoplasm of other parts of mouth: Secondary | ICD-10-CM | POA: Diagnosis not present

## 2021-09-12 DIAGNOSIS — D164 Benign neoplasm of bones of skull and face: Secondary | ICD-10-CM | POA: Diagnosis not present

## 2021-11-26 DIAGNOSIS — Z79899 Other long term (current) drug therapy: Secondary | ICD-10-CM | POA: Diagnosis not present

## 2021-11-26 DIAGNOSIS — Z8639 Personal history of other endocrine, nutritional and metabolic disease: Secondary | ICD-10-CM | POA: Diagnosis not present

## 2021-11-26 DIAGNOSIS — Z1211 Encounter for screening for malignant neoplasm of colon: Secondary | ICD-10-CM | POA: Diagnosis not present

## 2021-11-26 DIAGNOSIS — Z0000 Encounter for general adult medical examination without abnormal findings: Secondary | ICD-10-CM | POA: Diagnosis not present

## 2021-11-26 DIAGNOSIS — R739 Hyperglycemia, unspecified: Secondary | ICD-10-CM | POA: Diagnosis not present

## 2021-11-26 DIAGNOSIS — Z125 Encounter for screening for malignant neoplasm of prostate: Secondary | ICD-10-CM | POA: Diagnosis not present

## 2021-12-02 DIAGNOSIS — E7800 Pure hypercholesterolemia, unspecified: Secondary | ICD-10-CM | POA: Diagnosis not present

## 2021-12-02 DIAGNOSIS — Z79899 Other long term (current) drug therapy: Secondary | ICD-10-CM | POA: Diagnosis not present

## 2021-12-02 DIAGNOSIS — R7309 Other abnormal glucose: Secondary | ICD-10-CM | POA: Diagnosis not present

## 2021-12-02 DIAGNOSIS — Z125 Encounter for screening for malignant neoplasm of prostate: Secondary | ICD-10-CM | POA: Diagnosis not present

## 2021-12-03 ENCOUNTER — Ambulatory Visit (INDEPENDENT_AMBULATORY_CARE_PROVIDER_SITE_OTHER): Payer: Medicare HMO | Admitting: Dermatology

## 2021-12-03 DIAGNOSIS — L300 Nummular dermatitis: Secondary | ICD-10-CM | POA: Diagnosis not present

## 2021-12-03 DIAGNOSIS — L578 Other skin changes due to chronic exposure to nonionizing radiation: Secondary | ICD-10-CM | POA: Diagnosis not present

## 2021-12-03 DIAGNOSIS — L821 Other seborrheic keratosis: Secondary | ICD-10-CM

## 2021-12-03 DIAGNOSIS — D229 Melanocytic nevi, unspecified: Secondary | ICD-10-CM

## 2021-12-03 DIAGNOSIS — Z85828 Personal history of other malignant neoplasm of skin: Secondary | ICD-10-CM | POA: Diagnosis not present

## 2021-12-03 DIAGNOSIS — Z1283 Encounter for screening for malignant neoplasm of skin: Secondary | ICD-10-CM

## 2021-12-03 DIAGNOSIS — L820 Inflamed seborrheic keratosis: Secondary | ICD-10-CM

## 2021-12-03 DIAGNOSIS — D1800 Hemangioma unspecified site: Secondary | ICD-10-CM

## 2021-12-03 DIAGNOSIS — L739 Follicular disorder, unspecified: Secondary | ICD-10-CM

## 2021-12-03 DIAGNOSIS — L814 Other melanin hyperpigmentation: Secondary | ICD-10-CM

## 2021-12-03 DIAGNOSIS — D2239 Melanocytic nevi of other parts of face: Secondary | ICD-10-CM

## 2021-12-03 MED ORDER — KETOCONAZOLE 2 % EX SHAM: MEDICATED_SHAMPOO | CUTANEOUS | 3 refills | Status: AC

## 2021-12-03 MED ORDER — TRIAMCINOLONE ACETONIDE 0.1 % EX CREA: TOPICAL_CREAM | CUTANEOUS | 2 refills | Status: AC

## 2021-12-03 MED ORDER — FLUOCINONIDE 0.05 % EX SOLN: CUTANEOUS | 1 refills | Status: DC

## 2021-12-03 NOTE — Patient Instructions (Addendum)
Cryotherapy Aftercare  Wash gently with soap and water everyday.   Apply Vaseline and Band-Aid daily until healed.   Topical steroids (such as triamcinolone, fluocinolone, fluocinonide, mometasone, clobetasol, halobetasol, betamethasone, hydrocortisone) can cause thinning and lightening of the skin if they are used for too long in the same area. Your physician has selected the right strength medicine for your problem and area affected on the body. Please use your medication only as directed by your physician to prevent side effects.     Dry Skin Care  What causes dry skin?  Dry skin is common and results from inadequate moisture in the outer skin layers. Dry skin usually results from the excessive loss of moisture from the skin surface. This occurs due to two major factors: Normally the skin's oil glands deposit a layer of oil on the skin's surface. This layer of oil prevents the loss of moisture from the skin. Exposure to soaps, cleaners, solvents, and disinfectants removes this oily film, allowing water to escape. Water loss from the skin increases when the humidity is low. During winter months we spend a lot of time indoors where the air is heated. Heated air has very low humidity. This also contributes to dry skin.  A tendency for dry skin may accompany such disorders as eczema. Also, as people age, the number of functioning oil glands decreases, and the tendency toward dry skin can be a sensation of skin tightness when emerging from the shower.  How do I manage dry skin?  Humidify your environment. This can be accomplished by using a humidifier in your bedroom at night during winter months. Bathing can actually put moisture back into your skin if done right. Take the following steps while bathing to sooth dry skin: Avoid hot water, which only dries the skin and makes itching worse. Use warm water. Avoid washcloths or extensive rubbing or scrubbing. Use mild soaps like unscented Dove,  Oil of Olay, Cetaphil, Basis, or CeraVe. If you take baths rather than showers, rinse off soap residue with clean water before getting out of tub. Once out of the shower/tub, pat dry gently with a soft towel. Leave your skin damp. While still damp, apply any medicated ointment/cream you were prescribed to the affected areas. After you apply your medicated ointment/cream, then apply your moisturizer to your whole body.This is the most important step in dry skin care. If this is omitted, your skin will continue to be dry. The choice of moisturizer is also very important. In general, lotion will not provider enough moisture to severely dry skin because it is water based. You should use an ointment or cream. Moisturizers should also be unscented. Good choices include Vaseline (plain petrolatum), Aquaphor, Cetaphil, CeraVe, Vanicream, DML Forte, Aveeno moisture, or Eucerin Cream. Bath oils can be helpful, but do not replace the application of moisturizer after the bath. In addition, they make the tub slippery causing an increased risk for falls. Therefore, we do not recommend their use.  Due to recent changes in healthcare laws, you may see results of your pathology and/or laboratory studies on MyChart before the doctors have had a chance to review them. We understand that in some cases there may be results that are confusing or concerning to you. Please understand that not all results are received at the same time and often the doctors may need to interpret multiple results in order to provide you with the best plan of care or course of treatment. Therefore, we ask that you please give  [place] 2 business days to thoroughly review all your results before contacting the office for clarification. Should we see a critical lab result, you will be contacted sooner.   If You Need Anything After Your Visit  If you have any questions or concerns for your doctor, please call our main line at [phone] and press option 4  to reach your doctor's medical assistant. If no one answers, please leave a voicemail as directed and we will return your call as soon as possible. Messages left after 4 pm will be answered the following business day.   You may also send [place] a message via [place]. We typically respond to MyChart messages within 1-2 business days.  For prescription refills, please ask your pharmacy to contact our office. Our fax number is [phone].  If you have an urgent issue when the clinic is closed that cannot wait until the next business day, you can page your doctor at the number below.    Please note that while we do our best to be available for urgent issues outside of office hours, we are not available 24/7.   If you have an urgent issue and are unable to reach [place], you may choose to seek medical care at your doctor's office, retail clinic, urgent care center, or emergency room.  If you have a medical emergency, please immediately call 911 or go to the emergency department.  Pager Numbers  - Dr. Nehemiah Massed: [phone]  - Dr. Laurence Ferrari: [phone]  - Dr. Nicole Kindred: [phone]  In the event of inclement weather, please call our main line at [phone] for an update on the status of any delays or closures.  Dermatology Medication Tips: Please keep the boxes that topical medications come in in order to help keep track of the instructions about where and how to use these. Pharmacies typically print the medication instructions only on the boxes and not directly on the medication tubes.   If your medication is too expensive, please contact our office at [phone] option 4 or send [place] a message through [place].   We are unable to tell what your co-pay for medications will be in advance as this is different depending on your insurance coverage. However, we may be able to find a substitute medication at lower cost or fill out paperwork to get insurance to cover a needed medication.   If a prior  authorization is required to get your medication covered by your insurance company, please allow [place] 1-2 business days to complete this process.  Drug prices often vary depending on where the prescription is filled and some pharmacies may offer cheaper prices.  The website www.goodrx.com contains coupons for medications through different pharmacies. The prices here do not account for what the cost may be with help from insurance (it may be cheaper with your insurance), but the website can give you the price if you did not use any insurance.  - You can print the associated coupon and take it with your prescription to the pharmacy.  - You may also stop by our office during regular business hours and pick up a GoodRx coupon card.  - If you need your prescription sent electronically to a different pharmacy, notify our office through [facility] or by phone at [phone] option 4.     Si Usted Necesita Algo Despus de Su Visita  Tambin puede enviarnos un mensaje a travs de Pharmacist, community. Por lo general respondemos a los mensajes de MyChart en el transcurso de 1 a  2 das hbiles.  Para renovar recetas, por favor pida a su farmacia que se ponga en contacto con nuestra oficina. Harland Dingwall de fax es [place] [phone].  Si tiene un asunto urgente cuando la clnica est cerrada y que no puede esperar hasta el siguiente da hbil, puede llamar/localizar a su doctor(a) al nmero que aparece a continuacin.   Por favor, tenga en cuenta que aunque hacemos todo lo posible para estar disponibles para asuntos urgentes fuera del horario de [place], no estamos disponibles las 24 horas del da, los 7 das de la [place].   Si tiene un problema urgente y no puede comunicarse con nosotros, puede optar por buscar atencin mdica  en el consultorio de su doctor(a), en una clnica privada, en un centro de atencin urgente o en una sala de emergencias.  Si tiene Engineering geologist, por favor llame  inmediatamente al 911 o vaya a la sala de emergencias.  Nmeros de bper  - Dr. Nehemiah Massed: [phone]  - Dra. Moye: [phone]  - Dra. Nicole Kindred: [phone]  En caso de inclemencias del [place], por favor llame a Johnsie Kindred principal al [phone] para una actualizacin sobre el [place] de cualquier retraso o cierre.  Consejos para la medicacin en dermatologa: Por favor, guarde las cajas en las que vienen los medicamentos de uso tpico para ayudarle a seguir las instrucciones sobre dnde y cmo usarlos. Las farmacias generalmente imprimen las instrucciones del medicamento slo en las cajas y no directamente en los tubos del [place].   Si su medicamento es muy caro, por favor, pngase en contacto con Zigmund Daniel llamando al [phone] y presione la opcin 4 o envenos un mensaje a travs de Pharmacist, community.   No podemos decirle cul ser su copago por los medicamentos por adelantado ya que esto es diferente dependiendo de la cobertura de su seguro. Sin embargo, es posible que podamos encontrar un medicamento sustituto a Electrical engineer un formulario para que el seguro cubra el medicamento que se considera necesario.   Si se requiere una autorizacin previa para que su compaa de seguros [place] su medicamento, por favor permtanos de 1 a 2 das hbiles para completar este proceso.  Los precios de los medicamentos varan con frecuencia dependiendo del Environmental consultant de dnde se surte la receta y alguna farmacias pueden ofrecer precios ms baratos.  El sitio web www.goodrx.com tiene cupones para medicamentos de Airline pilot. Los precios aqu no tienen en cuenta lo que podra costar con la ayuda del seguro (puede ser ms barato con su seguro), pero el sitio web puede darle el precio si no utiliz Research scientist (physical sciences).  - Puede imprimir el cupn correspondiente y llevarlo con su receta a la farmacia.  - Tambin puede pasar por nuestra oficina durante el horario de atencin regular y Charity fundraiser  una tarjeta de cupones de GoodRx.  - Si necesita que su receta se enve electrnicamente a una farmacia diferente, informe a nuestra oficina a travs de MyChart de [place] o por telfono llamando al [phone] y presione la opcin 4.

## 2021-12-03 NOTE — Progress Notes (Signed)
Follow-Up Visit   Subjective  Franklin Eaton is a 71 y.o. male who presents for the following: Annual Exam.  The patient presents for Upper Body Skin Exam (UBSE) for skin cancer screening and mole check.  The patient has spots, moles and lesions to be evaluated, some may be new or changing. 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 He has several itchy spots on the back. He also has a history of bumps in the scalp that were itchy and painful, clear today. Not using a medicated shampoo.   Patient accompanied by wife.  The following portions of the chart were reviewed this encounter and updated as appropriate:       Review of Systems:  No other skin or systemic complaints except as noted in HPI or Assessment and Plan.  Objective  Well appearing patient in no apparent distress; mood and affect are within normal limits.  All skin waist up examined.  R mid jaw 4.0 mm pink flesh papule  L post shoulder x 1, R lower neck x 1, R mid back x 2, R flank x 1 Erythematous stuck-on, waxy papule or plaque  Scalp Clear today  back Pink scaly papules on the back    Assessment & Plan  Skin cancer screening performed today.  Actinic Damage - chronic, secondary to cumulative UV radiation exposure/sun exposure over time - diffuse scaly erythematous macules with underlying dyspigmentation - Recommend daily broad spectrum sunscreen SPF 30+ to sun-exposed areas, reapply every 2 hours as needed.  - Recommend staying in the shade or wearing long sleeves, sun glasses (UVA+UVB protection) and wide brim hats (4-inch brim around the entire circumference of the hat). - Call for new or changing lesions.  History of Squamous Cell Carcinoma of the Skin - No evidence of recurrence today of the left forearm - Recommend regular full body skin exams - Recommend daily broad spectrum sunscreen SPF 30+ to sun-exposed areas, reapply every 2 hours as needed.  - Call if any new or  changing lesions are noted between office visits  Seborrheic Keratoses - Stuck-on, waxy, tan-brown papules and/or plaques  - Benign-appearing - Discussed benign etiology and prognosis. - Observe - Call for any changes  Lentigines - Scattered tan macules - Due to sun exposure - Benign-appering, observe - Recommend daily broad spectrum sunscreen SPF 30+ to sun-exposed areas, reapply every 2 hours as needed. - Call for any changes  Hemangiomas - Red papules - Discussed benign nature - Observe - Call for any changes  Nevus R mid jaw  Benign-appearing.  Observation.  Call clinic for new or changing moles.  Recommend daily use of broad spectrum spf 30+ sunscreen to sun-exposed areas.   Inflamed seborrheic keratosis L post shoulder x 1, R lower neck x 1, R mid back x 2, R flank x 1  Symptomatic, irritating, patient would like treated.  Destruction of lesion - L post shoulder x 1, R lower neck x 1, R mid back x 2, R flank x 1  Destruction method: cryotherapy   Informed consent: discussed and consent obtained   Lesion destroyed using liquid nitrogen: Yes   Region frozen until ice ball extended beyond lesion: Yes   Outcome: patient tolerated procedure well with no complications   Post-procedure details: wound care instructions given   Additional details:  Prior to procedure, discussed risks of blister formation, small wound, skin dyspigmentation, or rare scar following cryotherapy. Recommend Vaseline ointment to treated areas  while healing.   Folliculitis Scalp  Start ketoconazole 2% shampoo massage into scalp, let sit several minutes before rinsing.  Start fluocinonide solution qd/bid to aas prn itchy bumps  fluocinonide (LIDEX) 0.05 % external solution - Scalp Apply a couple drops to affected areas scalp once to twice daily as needed for itchy bumps.  ketoconazole (NIZORAL) 2 % shampoo - Scalp Massage into scalp and let sit several minutes before rinsing. Use 2-3 times a  week.  Nummular dermatitis back  Recommend mild soap and moisturizing cream 1-2 times daily.  Gentle skin care handout provided.   Start TMC 0.1% Cream Apply to AA back qd/bid prn itch dsp 80g 2Rf.  Topical steroids (such as triamcinolone, fluocinolone, fluocinonide, mometasone, clobetasol, halobetasol, betamethasone, hydrocortisone) can cause thinning and lightening of the skin if they are used for too long in the same area. Your physician has selected the right strength medicine for your problem and area affected on the body. Please use your medication only as directed by your physician to prevent side effects.    triamcinolone cream (KENALOG) 0.1 % - back Apply to back once to twice daily as needed for itch. Avoid face, groin, axilla.  Melanocytic Nevi - Tan-brown and/or pink-flesh-colored symmetric macules and papules - Benign appearing on exam today - Observation - Call clinic for new or changing moles - Recommend daily use of broad spectrum spf 30+ sunscreen to sun-exposed areas.   Return in about 1 year (around 12/04/2022) for UBSE, Hx SCC.  IJamesetta Orleans, CMA, am acting as scribe for Brendolyn Patty, MD .  Documentation: I have reviewed the above documentation for accuracy and completeness, and I agree with the above.  Brendolyn Patty MD

## 2021-12-05 DIAGNOSIS — H0011 Chalazion right upper eyelid: Secondary | ICD-10-CM | POA: Diagnosis not present

## 2021-12-05 DIAGNOSIS — Z0100 Encounter for examination of eyes and vision without abnormal findings: Secondary | ICD-10-CM | POA: Diagnosis not present

## 2021-12-10 ENCOUNTER — Other Ambulatory Visit: Payer: Self-pay | Admitting: Internal Medicine

## 2021-12-10 DIAGNOSIS — F0781 Postconcussional syndrome: Secondary | ICD-10-CM

## 2021-12-10 DIAGNOSIS — R42 Dizziness and giddiness: Secondary | ICD-10-CM

## 2021-12-10 DIAGNOSIS — H539 Unspecified visual disturbance: Secondary | ICD-10-CM

## 2021-12-18 ENCOUNTER — Ambulatory Visit
Admission: RE | Admit: 2021-12-18 | Discharge: 2021-12-18 | Disposition: A | Discharge status: Home / Self Care | Payer: Medicare HMO | Source: Ambulatory Visit | Attending: Internal Medicine | Admitting: Internal Medicine

## 2021-12-18 DIAGNOSIS — F0781 Postconcussional syndrome: Secondary | ICD-10-CM | POA: Insufficient documentation

## 2021-12-18 DIAGNOSIS — G9389 Other specified disorders of brain: Secondary | ICD-10-CM | POA: Diagnosis not present

## 2021-12-18 DIAGNOSIS — H539 Unspecified visual disturbance: Secondary | ICD-10-CM | POA: Insufficient documentation

## 2021-12-18 DIAGNOSIS — R42 Dizziness and giddiness: Secondary | ICD-10-CM | POA: Diagnosis not present

## 2021-12-18 MED ORDER — GADOPICLENOL 0.5 MMOL/ML IV SOLN
6.0000 mL | Freq: Once | INTRAVENOUS | Status: AC | PRN
  Administered 2021-12-18: 5 mL via INTRAVENOUS

## 2021-12-25 DIAGNOSIS — Z1211 Encounter for screening for malignant neoplasm of colon: Secondary | ICD-10-CM | POA: Diagnosis not present

## 2022-01-07 DIAGNOSIS — H0015 Chalazion left lower eyelid: Secondary | ICD-10-CM | POA: Diagnosis not present

## 2022-01-23 DIAGNOSIS — H0015 Chalazion left lower eyelid: Secondary | ICD-10-CM | POA: Diagnosis not present

## 2022-02-26 DIAGNOSIS — J00 Acute nasopharyngitis [common cold]: Secondary | ICD-10-CM | POA: Diagnosis not present

## 2022-03-09 IMAGING — US US ABDOMEN COMPLETE
1 series · 13 of 25 positions shown · non-contrast
Comparison: Abdominal ultrasound 06/27/2019

CLINICAL DATA: Upper abdominal pain. Patient reports pain for
years.

EXAM:
ABDOMEN ULTRASOUND COMPLETE

[Series 1: us abdomen complete · 0.17mm/px · 13 of 98 slices shown]
[im 1/98]
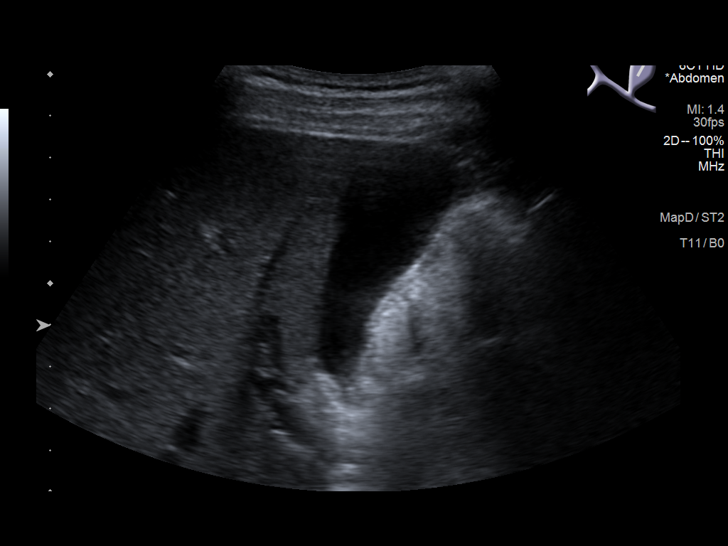
[im 9/98]
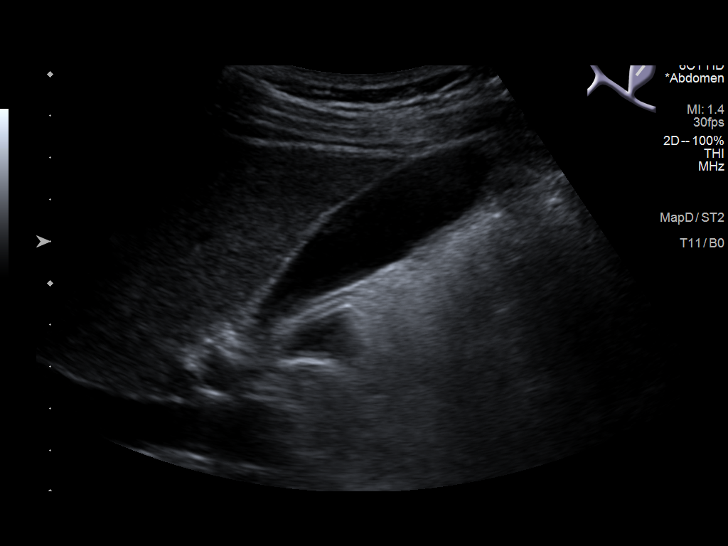
[im 17/98]
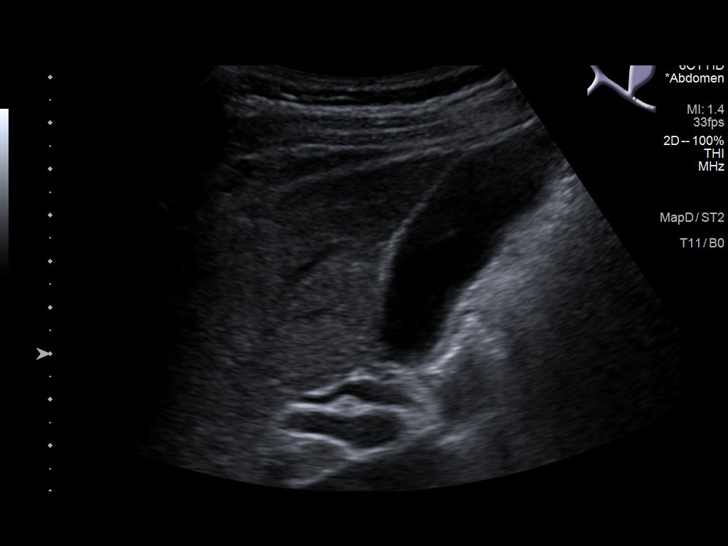
[im 25/98]
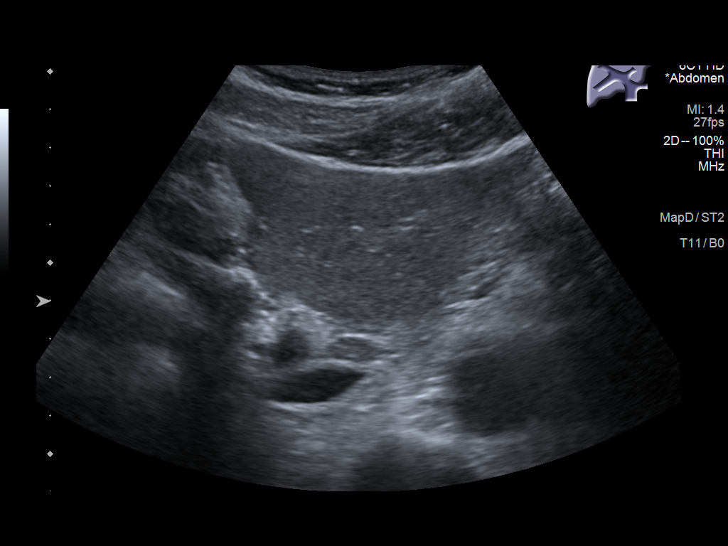
[im 33/98]
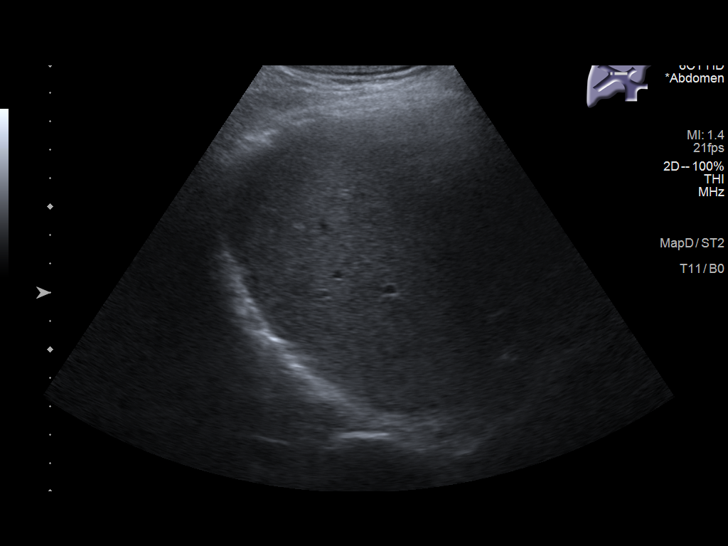
[im 41/98]
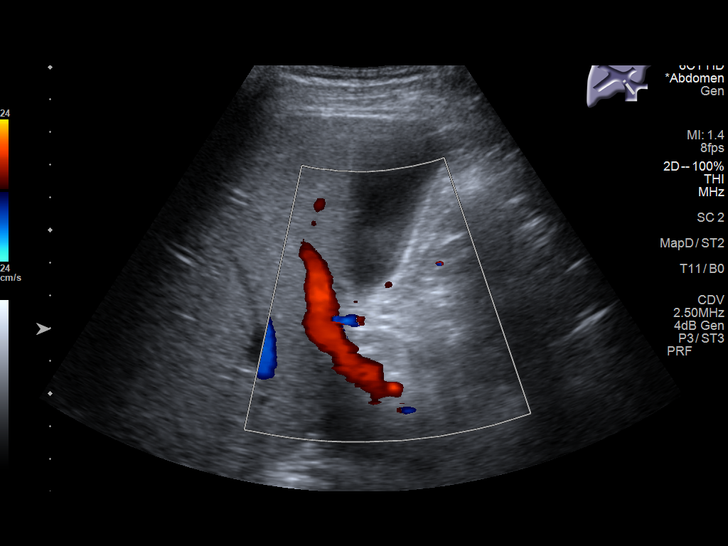
[im 49/98]
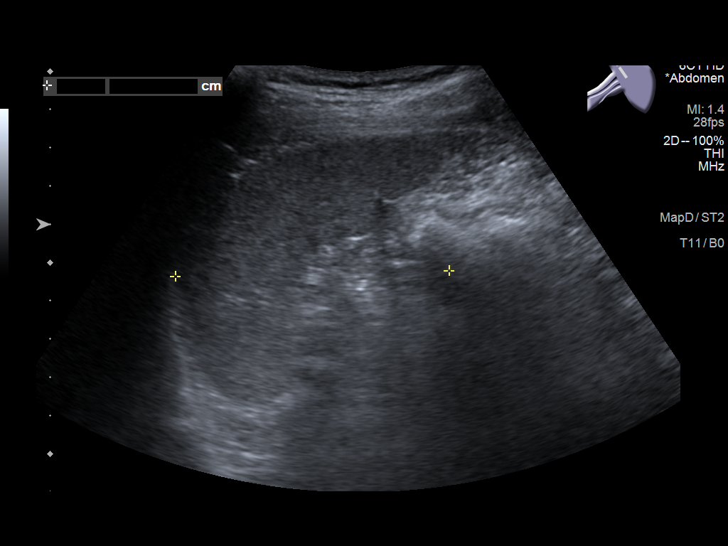
[im 57/98]
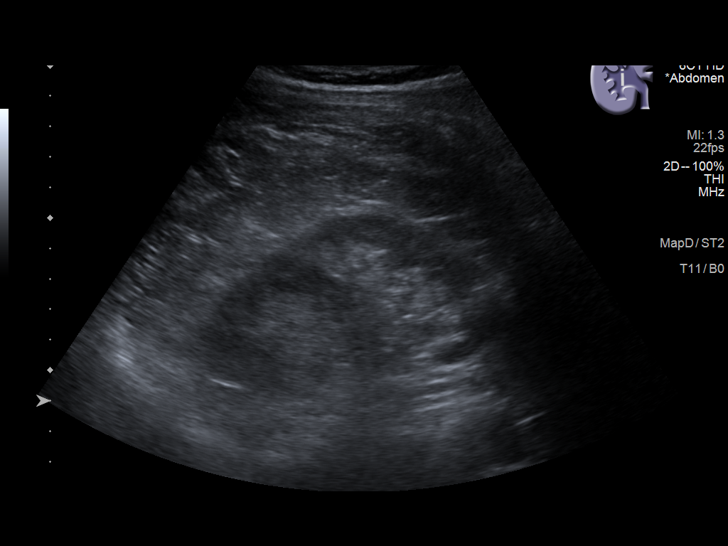
[im 65/98]
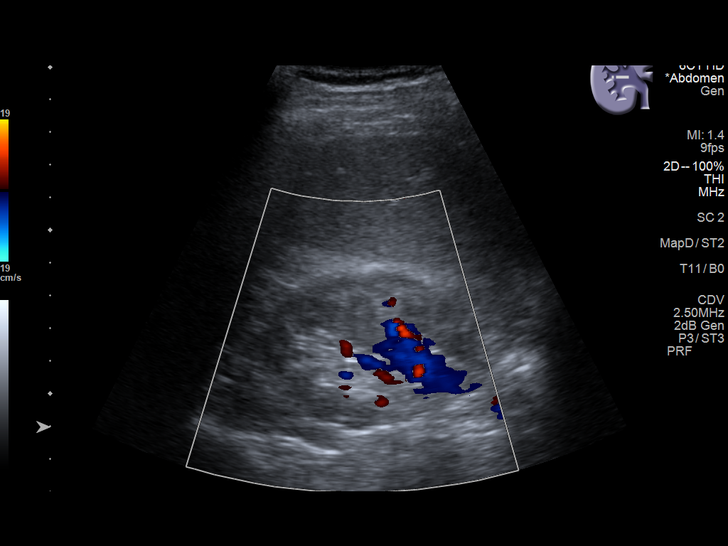
[im 73/98]
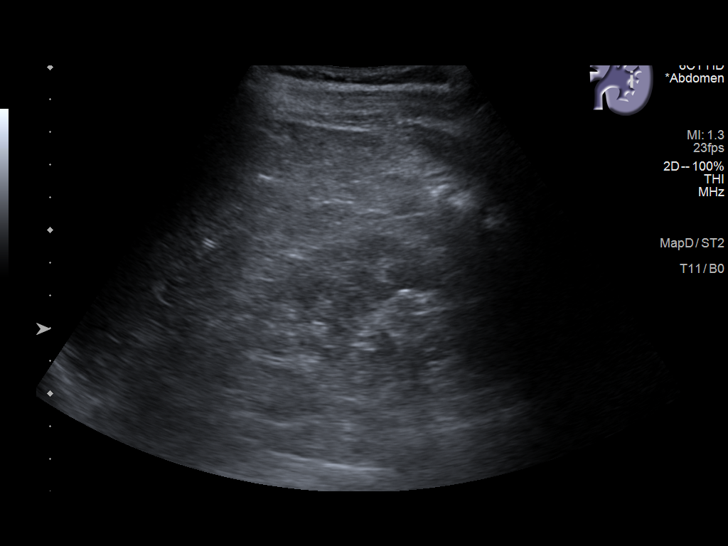
[im 81/98]
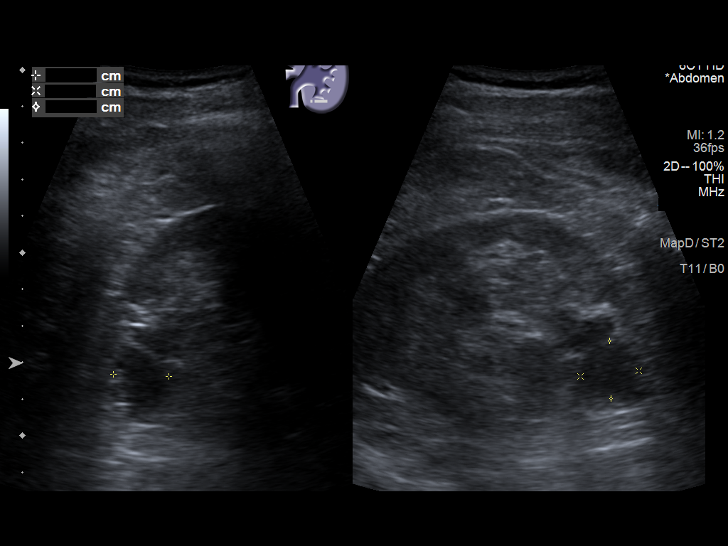
[im 89/98]
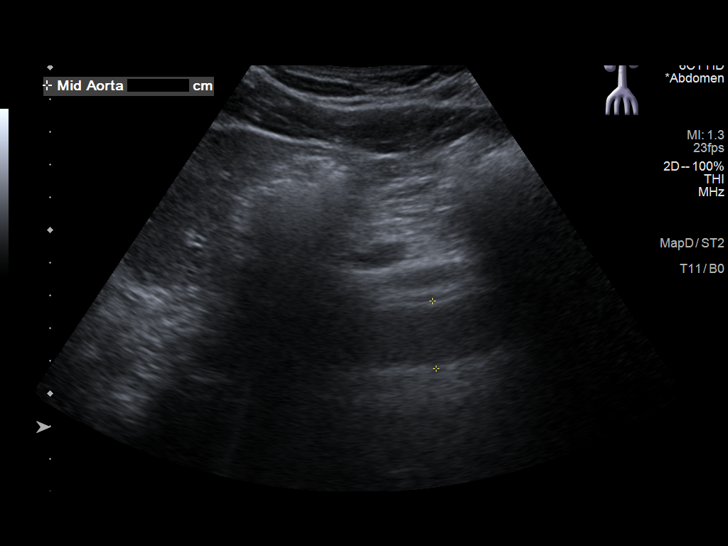
[im 98/98]
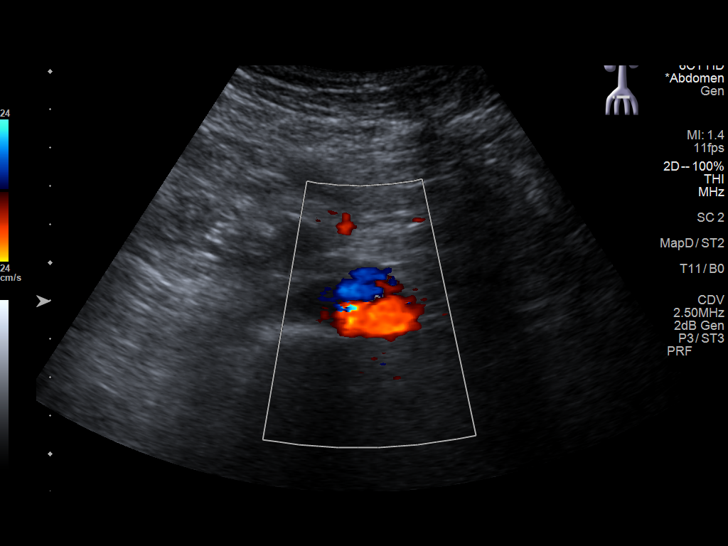

[13 of 25 positions shown; findings below may reference images not displayed]

FINDINGS: Gallbladder: Physiologically distended. No gallstones or wall
thickening visualized. No sonographic Murphy sign noted by
sonographer.

Common bile duct: Diameter: 2-4 mm, normal.

Liver: No focal lesion identified. Within normal limits in
parenchymal echogenicity. Portal vein is patent on color Doppler
imaging with normal direction of blood flow towards the liver.

IVC: No abnormality visualized.

Pancreas: Visualized portion unremarkable. Portions are obscured by
bowel gas.

Spleen: Size and appearance within normal limits.

Right Kidney: Length: 10.3 cm. Similar renal parenchymal thinning to
prior exam. Normal parenchymal echogenicity. No hydronephrosis. No
visualized stone or focal lesion.

Left Kidney: Length: 10.9 cm. Similar renal parenchymal thinning to
prior exam there is a 1.6 cm cyst in the lower pole. No solid
lesion. Normal parenchymal echogenicity. No hydronephrosis or stone.

Abdominal aorta: No aneurysm visualized.

Other findings: No abdominal ascites.
IMPRESSION: 1. Mild bilateral renal parenchymal thinning with small left renal
cyst.
2. Otherwise unremarkable sonographic appearance of the abdomen.
Particularly, normal sonographic appearance of the gallbladder.

## 2022-04-07 DIAGNOSIS — H0102A Squamous blepharitis right eye, upper and lower eyelids: Secondary | ICD-10-CM | POA: Diagnosis not present

## 2022-04-07 DIAGNOSIS — I718 Aortic aneurysm of unspecified site, ruptured: Secondary | ICD-10-CM | POA: Diagnosis not present

## 2022-04-07 DIAGNOSIS — H04123 Dry eye syndrome of bilateral lacrimal glands: Secondary | ICD-10-CM | POA: Diagnosis not present

## 2022-04-07 DIAGNOSIS — L718 Other rosacea: Secondary | ICD-10-CM | POA: Diagnosis not present

## 2022-04-07 DIAGNOSIS — H2513 Age-related nuclear cataract, bilateral: Secondary | ICD-10-CM | POA: Diagnosis not present

## 2022-04-07 DIAGNOSIS — H02055 Trichiasis without entropian left lower eyelid: Secondary | ICD-10-CM | POA: Diagnosis not present

## 2022-05-05 DIAGNOSIS — H0102B Squamous blepharitis left eye, upper and lower eyelids: Secondary | ICD-10-CM | POA: Diagnosis not present

## 2022-05-05 DIAGNOSIS — H0012 Chalazion right lower eyelid: Secondary | ICD-10-CM | POA: Diagnosis not present

## 2022-05-05 DIAGNOSIS — H0102A Squamous blepharitis right eye, upper and lower eyelids: Secondary | ICD-10-CM | POA: Diagnosis not present

## 2022-05-05 DIAGNOSIS — H04123 Dry eye syndrome of bilateral lacrimal glands: Secondary | ICD-10-CM | POA: Diagnosis not present

## 2022-06-09 DIAGNOSIS — H02055 Trichiasis without entropian left lower eyelid: Secondary | ICD-10-CM | POA: Diagnosis not present

## 2022-06-09 DIAGNOSIS — H35363 Drusen (degenerative) of macula, bilateral: Secondary | ICD-10-CM | POA: Diagnosis not present

## 2022-06-09 DIAGNOSIS — H04123 Dry eye syndrome of bilateral lacrimal glands: Secondary | ICD-10-CM | POA: Diagnosis not present

## 2022-06-09 DIAGNOSIS — H2513 Age-related nuclear cataract, bilateral: Secondary | ICD-10-CM | POA: Diagnosis not present

## 2022-06-09 DIAGNOSIS — H1045 Other chronic allergic conjunctivitis: Secondary | ICD-10-CM | POA: Diagnosis not present

## 2022-07-29 DIAGNOSIS — E785 Hyperlipidemia, unspecified: Secondary | ICD-10-CM | POA: Diagnosis not present

## 2022-07-29 DIAGNOSIS — R7303 Prediabetes: Secondary | ICD-10-CM | POA: Diagnosis not present

## 2022-07-29 DIAGNOSIS — Z79899 Other long term (current) drug therapy: Secondary | ICD-10-CM | POA: Diagnosis not present

## 2022-07-29 DIAGNOSIS — R0600 Dyspnea, unspecified: Secondary | ICD-10-CM | POA: Diagnosis not present

## 2022-07-29 DIAGNOSIS — R0609 Other forms of dyspnea: Secondary | ICD-10-CM | POA: Diagnosis not present

## 2022-08-01 DIAGNOSIS — R0600 Dyspnea, unspecified: Secondary | ICD-10-CM | POA: Diagnosis not present

## 2022-08-07 ENCOUNTER — Other Ambulatory Visit: Payer: Self-pay | Admitting: Internal Medicine

## 2022-08-07 DIAGNOSIS — R0602 Shortness of breath: Secondary | ICD-10-CM

## 2022-08-07 DIAGNOSIS — I2089 Other forms of angina pectoris: Secondary | ICD-10-CM | POA: Diagnosis not present

## 2022-08-07 DIAGNOSIS — R7303 Prediabetes: Secondary | ICD-10-CM | POA: Diagnosis not present

## 2022-08-07 DIAGNOSIS — R931 Abnormal findings on diagnostic imaging of heart and coronary circulation: Secondary | ICD-10-CM | POA: Diagnosis not present

## 2022-08-07 DIAGNOSIS — R011 Cardiac murmur, unspecified: Secondary | ICD-10-CM | POA: Diagnosis not present

## 2022-08-07 DIAGNOSIS — E7800 Pure hypercholesterolemia, unspecified: Secondary | ICD-10-CM | POA: Diagnosis not present

## 2022-08-08 DIAGNOSIS — Z79899 Other long term (current) drug therapy: Secondary | ICD-10-CM | POA: Diagnosis not present

## 2022-08-08 DIAGNOSIS — E7800 Pure hypercholesterolemia, unspecified: Secondary | ICD-10-CM | POA: Diagnosis not present

## 2022-08-08 DIAGNOSIS — R7303 Prediabetes: Secondary | ICD-10-CM | POA: Diagnosis not present

## 2022-08-19 DIAGNOSIS — I2089 Other forms of angina pectoris: Secondary | ICD-10-CM | POA: Diagnosis not present

## 2022-08-19 DIAGNOSIS — R0602 Shortness of breath: Secondary | ICD-10-CM | POA: Diagnosis not present

## 2022-09-04 ENCOUNTER — Telehealth (HOSPITAL_COMMUNITY): Payer: Self-pay | Admitting: Emergency Medicine

## 2022-09-04 ENCOUNTER — Encounter (HOSPITAL_COMMUNITY): Payer: Self-pay

## 2022-09-04 DIAGNOSIS — R079 Chest pain, unspecified: Secondary | ICD-10-CM

## 2022-09-04 MED ORDER — IVABRADINE HCL 5 MG PO TABS
15.0000 mg | ORAL_TABLET | Freq: Once | ORAL | 0 refills | Status: AC
Start: 2022-09-04 — End: 2022-09-04

## 2022-09-04 NOTE — Telephone Encounter (Signed)
Reaching out to patient to offer assistance regarding upcoming cardiac imaging study; pt verbalizes understanding of appt date/time, parking situation and where to check in, pre-test NPO status and medications ordered, and verified current allergies; name and call back number provided for further questions should they arise Wessie Shanks RN Navigator Cardiac Imaging [facility] [phone] office [phone] cell 

## 2022-09-04 NOTE — Telephone Encounter (Signed)
Attempted to call patient regarding upcoming cardiac CT appointment. °Left message on voicemail with name and callback number °Devony Mcgrady RN Navigator Cardiac Imaging °[facility] °[phone] Office °[phone] Cell ° °

## 2022-09-08 ENCOUNTER — Ambulatory Visit
Admission: RE | Admit: 2022-09-08 | Discharge: 2022-09-08 | Disposition: A | Discharge status: Home / Self Care | Payer: Medicare HMO | Source: Ambulatory Visit | Attending: Internal Medicine | Admitting: Internal Medicine

## 2022-09-08 ENCOUNTER — Other Ambulatory Visit: Payer: Self-pay | Admitting: Internal Medicine

## 2022-09-08 DIAGNOSIS — R931 Abnormal findings on diagnostic imaging of heart and coronary circulation: Secondary | ICD-10-CM | POA: Diagnosis not present

## 2022-09-08 DIAGNOSIS — I2089 Other forms of angina pectoris: Secondary | ICD-10-CM

## 2022-09-08 DIAGNOSIS — R0602 Shortness of breath: Secondary | ICD-10-CM

## 2022-09-08 MED ORDER — NITROGLYCERIN 0.4 MG SL SUBL: 0.4000 mg | SUBLINGUAL_TABLET | SUBLINGUAL | Status: DC | PRN

## 2022-09-08 MED ORDER — IOHEXOL 350 MG/ML SOLN
100.0000 mL | Freq: Once | INTRAVENOUS | Status: AC | PRN
  Administered 2022-09-08: 80 mL via INTRAVENOUS

## 2022-09-08 MED ORDER — NITROGLYCERIN 0.4 MG SL SUBL
0.4000 mg | SUBLINGUAL_TABLET | Freq: Once | SUBLINGUAL | Status: AC
  Administered 2022-09-08: 0.4 mg via SUBLINGUAL
  Filled 2022-09-08: qty 25

## 2022-09-08 NOTE — Progress Notes (Signed)
Pt reports after taking ivabradine, endorses dizziness and fatigue- will only give one nitroglycerin tablet for test due to symptoms

## 2022-09-08 NOTE — Progress Notes (Signed)
Patient tolerated procedure well. Ambulate w/o difficulty. Denies any lightheadedness or being dizzy, endorses slight "fullness" in head after taking nitroglycerin. Pt denies any pain at this time. Sitting in chair, pt is encouraged to drink additional water throughout the day and reason explained to patient. Patient verbalized understanding and all questions answered. ABC intact. No further needs at this time. Discharge from procedure area w/o issues.

## 2022-09-17 DIAGNOSIS — R011 Cardiac murmur, unspecified: Secondary | ICD-10-CM | POA: Diagnosis not present

## 2022-09-17 DIAGNOSIS — R0602 Shortness of breath: Secondary | ICD-10-CM | POA: Diagnosis not present

## 2022-09-17 DIAGNOSIS — R931 Abnormal findings on diagnostic imaging of heart and coronary circulation: Secondary | ICD-10-CM | POA: Diagnosis not present

## 2022-09-17 DIAGNOSIS — E785 Hyperlipidemia, unspecified: Secondary | ICD-10-CM | POA: Diagnosis not present

## 2022-09-17 DIAGNOSIS — I2510 Atherosclerotic heart disease of native coronary artery without angina pectoris: Secondary | ICD-10-CM | POA: Diagnosis not present

## 2022-09-17 DIAGNOSIS — E7800 Pure hypercholesterolemia, unspecified: Secondary | ICD-10-CM | POA: Diagnosis not present

## 2022-12-15 ENCOUNTER — Ambulatory Visit (INDEPENDENT_AMBULATORY_CARE_PROVIDER_SITE_OTHER): Payer: Medicare HMO | Admitting: Dermatology

## 2022-12-15 ENCOUNTER — Encounter: Payer: Self-pay | Admitting: Dermatology

## 2022-12-15 VITALS — BP 106/61

## 2022-12-15 DIAGNOSIS — L570 Actinic keratosis: Secondary | ICD-10-CM

## 2022-12-15 DIAGNOSIS — Z1283 Encounter for screening for malignant neoplasm of skin: Secondary | ICD-10-CM

## 2022-12-15 DIAGNOSIS — L820 Inflamed seborrheic keratosis: Secondary | ICD-10-CM | POA: Diagnosis not present

## 2022-12-15 DIAGNOSIS — L821 Other seborrheic keratosis: Secondary | ICD-10-CM

## 2022-12-15 DIAGNOSIS — Z85828 Personal history of other malignant neoplasm of skin: Secondary | ICD-10-CM

## 2022-12-15 DIAGNOSIS — W908XXA Exposure to other nonionizing radiation, initial encounter: Secondary | ICD-10-CM | POA: Diagnosis not present

## 2022-12-15 DIAGNOSIS — D1801 Hemangioma of skin and subcutaneous tissue: Secondary | ICD-10-CM

## 2022-12-15 DIAGNOSIS — L578 Other skin changes due to chronic exposure to nonionizing radiation: Secondary | ICD-10-CM

## 2022-12-15 DIAGNOSIS — Z872 Personal history of diseases of the skin and subcutaneous tissue: Secondary | ICD-10-CM

## 2022-12-15 DIAGNOSIS — L739 Follicular disorder, unspecified: Secondary | ICD-10-CM | POA: Diagnosis not present

## 2022-12-15 DIAGNOSIS — L814 Other melanin hyperpigmentation: Secondary | ICD-10-CM | POA: Diagnosis not present

## 2022-12-15 DIAGNOSIS — D229 Melanocytic nevi, unspecified: Secondary | ICD-10-CM

## 2022-12-15 MED ORDER — FLUOCINONIDE 0.05 % EX SOLN: 1.0000 | CUTANEOUS | 3 refills | Status: AC

## 2022-12-15 NOTE — Patient Instructions (Addendum)
Cryotherapy Aftercare  Wash gently with soap and water everyday.   Apply Vaseline and Band-Aid daily until healed.    Due to recent changes in healthcare laws, you may see results of your pathology and/or laboratory studies on MyChart before the doctors have had a chance to review them. We understand that in some cases there may be results that are confusing or concerning to you. Please understand that not all results are received at the same time and often the doctors may need to interpret multiple results in order to provide you with the best plan of care or course of treatment. Therefore, we ask that you please give [place] 2 business days to thoroughly review all your results before contacting the office for clarification. Should we see a critical lab result, you will be contacted sooner.   If You Need Anything After Your Visit  If you have any questions or concerns for your doctor, please call our main line at [phone] and press option 4 to reach your doctor's medical assistant. If no one answers, please leave a voicemail as directed and we will return your call as soon as possible. Messages left after 4 pm will be answered the following business day.   You may also send [place] a message via MyChart. We typically respond to MyChart messages within 1-2 business days.  For prescription refills, please ask your pharmacy to contact our office. Our fax number is [phone].  If you have an urgent issue when the clinic is closed that cannot wait until the next business day, you can page your doctor at the number below.    Please note that while we do our best to be available for urgent issues outside of office hours, we are not available 24/7.   If you have an urgent issue and are unable to reach [place], you may choose to seek medical care at your doctor's office, retail clinic, urgent care center, or emergency room.  If you have a medical emergency, please immediately call 911 or go to the emergency  department.  Pager Numbers  - Dr. Gwen Pounds: [phone]  - Dr. Roseanne Reno: [phone]  - Dr. Katrinka Blazing: [phone]   In the event of inclement weather, please call our main line at [phone] for an update on the status of any delays or closures.  Dermatology Medication Tips: Please keep the boxes that topical medications come in in order to help keep track of the instructions about where and how to use these. Pharmacies typically print the medication instructions only on the boxes and not directly on the medication tubes.   If your medication is too expensive, please contact our office at [phone] option 4 or send [place] a message through MyChart.   We are unable to tell what your co-pay for medications will be in advance as this is different depending on your insurance coverage. However, we may be able to find a substitute medication at lower cost or fill out paperwork to get insurance to cover a needed medication.   If a prior authorization is required to get your medication covered by your insurance company, please allow [place] 1-2 business days to complete this process.  Drug prices often vary depending on where the prescription is filled and some pharmacies may offer cheaper prices.  The website www.goodrx.com contains coupons for medications through different pharmacies. The prices here do not account for what the cost may be with help from insurance (it may be cheaper with your insurance), but the website can  give you the price if you did not use any insurance.  - You can print the associated coupon and take it with your prescription to the pharmacy.  - You may also stop by our office during regular business hours and pick up a GoodRx coupon card.  - If you need your prescription sent electronically to a different pharmacy, notify our office through [facility] or by phone at [phone] option 4.     Si Usted Necesita Algo Despus de Su Visita  Tambin puede enviarnos  un mensaje a travs de Clinical cytogeneticist. Por lo general respondemos a los mensajes de MyChart en el transcurso de 1 a 2 das hbiles.  Para renovar recetas, por favor pida a su farmacia que se ponga en contacto con nuestra oficina. Annie Sable de fax es [place] [phone].  Si tiene un asunto urgente cuando la clnica est cerrada y que no puede esperar hasta el siguiente da hbil, puede llamar/localizar a su doctor(a) al nmero que aparece a continuacin.   Por favor, tenga en cuenta que aunque hacemos todo lo posible para estar disponibles para asuntos urgentes fuera del horario de [place], no estamos disponibles las 24 horas del da, los 7 [address]  [address] de la [place].   Si tiene un problema urgente y no puede comunicarse con nosotros, puede optar por buscar atencin mdica  en el consultorio de su doctor(a), en una clnica privada, en un centro de atencin urgente o en una sala de emergencias.  Si tiene Engineer, drilling, por favor llame inmediatamente al 911 o vaya a la sala de emergencias.  Nmeros de bper  - Dr. Gwen Pounds: [phone]  - Dra. Roseanne Reno: [phone]  - Dr. Katrinka Blazing: [phone]   En caso de inclemencias del tiempo, por favor llame a Lacy Duverney principal al [phone] para una actualizacin sobre el [place] de cualquier retraso o cierre.  Consejos para la medicacin en dermatologa: Por favor, guarde las cajas en las que vienen los medicamentos de uso tpico para ayudarle a seguir las instrucciones sobre dnde y cmo usarlos. Las farmacias generalmente imprimen las instrucciones del medicamento slo en las cajas y no directamente en los tubos del [place].   Si su medicamento es muy caro, por favor, pngase en contacto con Rolm Gala llamando al [phone] y presione la opcin 4 o envenos un mensaje a travs de Clinical cytogeneticist.   No podemos decirle cul ser su copago por los medicamentos por adelantado ya que esto es diferente dependiendo de la cobertura de su seguro. Sin  embargo, es posible que podamos encontrar un medicamento sustituto a Audiological scientist un formulario para que el seguro cubra el medicamento que se considera necesario.   Si se requiere una autorizacin previa para que su compaa de seguros [place] su medicamento, por favor permtanos de 1 a 2 das hbiles para completar [address]  Los precios de los medicamentos varan con frecuencia dependiendo del Environmental consultant de dnde se surte la receta y alguna farmacias pueden ofrecer precios ms baratos.  El sitio web www.goodrx.com tiene cupones para medicamentos de Health and safety inspector. Los precios aqu no tienen en cuenta lo que podra costar con la ayuda del seguro (puede ser ms barato con su seguro), pero el sitio web puede darle el precio si no utiliz Tourist information centre manager.  - Puede imprimir el cupn correspondiente y llevarlo con su receta a la farmacia.  - Tambin puede pasar por nuestra oficina durante el horario de atencin regular y Education officer, museum una tarjeta de cupones de GoodRx.  -  Si necesita que su receta se enve electrnicamente a Psychiatrist, informe a nuestra oficina a travs de MyChart de [place] o por telfono llamando al [phone] y presione la opcin 4.

## 2022-12-15 NOTE — Progress Notes (Signed)
Follow-Up Visit   Subjective  Franklin Eaton is a 72 y.o. male who presents for the following: Skin Cancer Screening and Upper Body Skin Exam, hx of SCC, hx of AKs Folliculitis scalp, pt used Ketoconazole 2% shampoo and Fluocinonide sol in past and he ran out, come up every 2 wks, sore to touch. Meds not helping.  Pt is moving to [place], but will be back in August in the summer, and will schedule his next annual appt then.  Patient accompanied by wife.  The patient presents for Upper Body Skin Exam (UBSE) for skin cancer screening and mole check. The patient has spots, moles and lesions to be evaluated, some may be new or changing and the patient may have concern these could be cancer.    The following portions of the chart were reviewed this encounter and updated as appropriate: medications, allergies, medical history  Review of Systems:  No other skin or systemic complaints except as noted in HPI or Assessment and Plan.  Objective  Well appearing patient in no apparent distress; mood and affect are within normal limits.  All skin waist up examined. Relevant physical exam findings are noted in the Assessment and Plan.  Left Upper Back x 1 Stuck on waxy papule with erythema  nasal dorsum x 1 Pink scaly macule    Assessment & Plan   Inflamed seborrheic keratosis Left Upper Back x 1  Symptomatic, irritating, patient would like treated.  Destruction of lesion - Left Upper Back x 1  Destruction method: cryotherapy   Informed consent: discussed and consent obtained   Lesion destroyed using liquid nitrogen: Yes   Region frozen until ice ball extended beyond lesion: Yes   Outcome: patient tolerated procedure well with no complications   Post-procedure details: wound care instructions given   Additional details:  Prior to procedure, discussed risks of blister formation, small wound, skin dyspigmentation, or rare scar following cryotherapy. Recommend Vaseline ointment to  treated areas while healing.   AK (actinic keratosis) nasal dorsum x 1  Actinic keratoses are precancerous spots that appear secondary to cumulative UV radiation exposure/sun exposure over time. They are chronic with expected duration over 1 year. A portion of actinic keratoses will progress to squamous cell carcinoma of the skin. It is not possible to reliably predict which spots will progress to skin cancer and so treatment is recommended to prevent development of skin cancer.  Recommend daily broad spectrum sunscreen SPF 30+ to sun-exposed areas, reapply every 2 hours as needed.  Recommend staying in the shade or wearing long sleeves, sun glasses (UVA+UVB protection) and wide brim hats (4-inch brim around the entire circumference of the hat). Call for new or changing lesions.  Destruction of lesion - nasal dorsum x 1  Destruction method: cryotherapy   Informed consent: discussed and consent obtained   Lesion destroyed using liquid nitrogen: Yes   Region frozen until ice ball extended beyond lesion: Yes   Outcome: patient tolerated procedure well with no complications   Post-procedure details: wound care instructions given   Additional details:  Prior to procedure, discussed risks of blister formation, small wound, skin dyspigmentation, or rare scar following cryotherapy. Recommend Vaseline ointment to treated areas while healing.    Skin cancer screening performed today.  Actinic Damage - Chronic condition, secondary to cumulative UV/sun exposure - diffuse scaly erythematous macules with underlying dyspigmentation - Recommend daily broad spectrum sunscreen SPF 30+ to sun-exposed areas, reapply every 2 hours as needed.  - Staying in  the shade or wearing long sleeves, sun glasses (UVA+UVB protection) and wide brim hats (4-inch brim around the entire circumference of the hat) are also recommended for sun protection.  - Call for new or changing lesions.  Lentigines, Seborrheic  Keratoses, Hemangiomas - Benign normal skin lesions - Benign-appearing - Call for any changes  Melanocytic Nevi - Tan-brown and/or pink-flesh-colored symmetric macules and papules - Benign appearing on exam today - Observation - Call clinic for new or changing moles - Recommend daily use of broad spectrum spf 30+ sunscreen to sun-exposed areas.   HISTORY OF SQUAMOUS CELL CARCINOMA OF THE SKIN - No evidence of recurrence today- L distal forearm - Recommend regular full body skin exams - Recommend daily broad spectrum sunscreen SPF 30+ to sun-exposed areas, reapply every 2 hours as needed.  - Call if any new or changing lesions are noted between office visits    FOLLICULITIS Scalp, back, chest, forehead Exam: scattered resolving follicular paps occipital scalp, follicular pink paps back, chest, pink pap L neck  Chronic and persistent condition with duration or expected duration over one year. Condition is bothersome/symptomatic for patient. Currently flared.   Folliculitis occurs due to inflammation of the superficial hair follicle (pore), resulting in acne-like lesions (pus bumps). It can be infectious (bacterial, fungal) or noninfectious (shaving, tight clothing, heat/sweat, medications).  Folliculitis can be acute or chronic and recommended treatment depends on the underlying cause of folliculitis.  Treatment Plan: Start Doxycycline 20mg  2 po qo with food and drink  Start Ciclopirox shampoo 3x/wk wash scalp let sit 5-10 minutes and rinse out, alternate shampoo with H&S shampoo Start Fluocinonide solution 1-2 gtts qd/bid aa scalp prn itching, avoid f/g/a  Recommend OTC benzoyl peroxide cleanser, wash affected areas daily in shower, let sit several minutes prior to rinsing.  May bleach towels if not rinsed off completely.  Recommended brands include Panoxyl 4% Creamy Wash, CeraVe Acne Foaming Cream wash, or Cetaphil Gentle Clear Complexion-Clearing BPO Acne Cleanser.  Sample given of  CeraVe acne wash  Doxycycline should be taken with food to prevent nausea. Do not lay down for 30 minutes after taking. Be cautious with sun exposure and use good sun protection while on this medication. Pregnant women should not take this medication.    Topical steroids (such as triamcinolone, fluocinolone, fluocinonide, mometasone, clobetasol, halobetasol, betamethasone, hydrocortisone) can cause thinning and lightening of the skin if they are used for too long in the same area. Your physician has selected the right strength medicine for your problem and area affected on the body. Please use your medication only as directed by your physician to prevent side effects.    Return in about 10 months (around 10/14/2023) for UBSE, Hx of SCC.  I, Ardis Rowan, RMA, am acting as scribe for Willeen Niece, MD .   Documentation: I have reviewed the above documentation for accuracy and completeness, and I agree with the above.  Willeen Niece, MD

## 2022-12-31 DIAGNOSIS — Z0000 Encounter for general adult medical examination without abnormal findings: Secondary | ICD-10-CM | POA: Diagnosis not present

## 2022-12-31 DIAGNOSIS — Z79899 Other long term (current) drug therapy: Secondary | ICD-10-CM | POA: Diagnosis not present

## 2022-12-31 DIAGNOSIS — Z23 Encounter for immunization: Secondary | ICD-10-CM | POA: Diagnosis not present

## 2022-12-31 DIAGNOSIS — Z1213 Encounter for screening for malignant neoplasm of small intestine: Secondary | ICD-10-CM | POA: Diagnosis not present

## 2022-12-31 DIAGNOSIS — E785 Hyperlipidemia, unspecified: Secondary | ICD-10-CM | POA: Diagnosis not present

## 2022-12-31 DIAGNOSIS — R7303 Prediabetes: Secondary | ICD-10-CM | POA: Diagnosis not present

## 2022-12-31 DIAGNOSIS — Z1211 Encounter for screening for malignant neoplasm of colon: Secondary | ICD-10-CM | POA: Diagnosis not present

## 2022-12-31 DIAGNOSIS — E538 Deficiency of other specified B group vitamins: Secondary | ICD-10-CM | POA: Diagnosis not present

## 2022-12-31 DIAGNOSIS — Z125 Encounter for screening for malignant neoplasm of prostate: Secondary | ICD-10-CM | POA: Diagnosis not present

## 2023-01-08 DIAGNOSIS — Z1211 Encounter for screening for malignant neoplasm of colon: Secondary | ICD-10-CM | POA: Diagnosis not present

## 2023-10-05 ENCOUNTER — Ambulatory Visit: Payer: Medicare HMO | Admitting: Dermatology
